# Patient Record
Sex: Male | Born: 1965 | Race: Black or African American | Hispanic: No | Marital: Single | Smoking: Former smoker
Health system: Southern US, Community
[De-identification: ages and names within clinical notes are randomized; demographics above are authoritative.]

## PROBLEM LIST (undated history)

## (undated) DIAGNOSIS — E119 Type 2 diabetes mellitus without complications: Secondary | ICD-10-CM

---

## 2011-12-17 ENCOUNTER — Encounter: Payer: Self-pay | Admitting: *Deleted

## 2011-12-17 ENCOUNTER — Emergency Department (INDEPENDENT_AMBULATORY_CARE_PROVIDER_SITE_OTHER)
Admission: EM | Admit: 2011-12-17 | Discharge: 2011-12-17 | Disposition: A | Discharge status: Home / Self Care | Payer: Self-pay | Source: Home / Self Care | Attending: Emergency Medicine | Admitting: Emergency Medicine

## 2011-12-17 DIAGNOSIS — K047 Periapical abscess without sinus: Secondary | ICD-10-CM

## 2011-12-17 MED ORDER — PENICILLIN V POTASSIUM 500 MG PO TABS: 500.0000 mg | ORAL_TABLET | Freq: Four times a day (QID) | ORAL | Status: AC

## 2011-12-17 MED ORDER — CLINDAMYCIN HCL 150 MG PO CAPS: 150.0000 mg | ORAL_CAPSULE | Freq: Three times a day (TID) | ORAL | Status: AC

## 2011-12-17 MED ORDER — HYDROCODONE-ACETAMINOPHEN 5-325 MG PO TABS: 2.0000 | ORAL_TABLET | Freq: Four times a day (QID) | ORAL | Status: AC | PRN

## 2011-12-17 NOTE — ED Notes (Signed)
PT  HAS  R  SIDED  FACIAL  SWELLING    X  5  DAYS  HE  REPORTS  THE   SWELLING IS  PAINFULL  TO  THE  [facility]

## 2011-12-17 NOTE — ED Provider Notes (Addendum)
History     CSN: [id number]  Arrival date & time 12/17/11  1123   First MD Initiated Contact with Patient 12/17/11 1129      Chief Complaint  Patient presents with  . Facial Swelling    (Consider location/radiation/quality/duration/timing/severity/associated sxs/prior treatment) HPI Comments: For about 5 days been noticing the R side of my face has becoming swollen and tender" Dontt know if this is coming from a tooth infection or what have felt some throbbing pains as well"  The history is provided by the patient.    History reviewed. No pertinent past medical history.  History reviewed. No pertinent past surgical history.  History reviewed. No pertinent family history.  History  Substance Use Topics  . Smoking status: Not on file  . Smokeless tobacco: Not on file  . Alcohol Use: Not on file      Review of Systems  Constitutional: Negative for fever.  Eyes: Negative for pain and visual disturbance.    Allergies  Review of patient's allergies indicates no known allergies.  Home Medications   Current Outpatient Rx  Name Route Sig Dispense Refill  . CLINDAMYCIN HCL 150 MG PO CAPS Oral Take 1 capsule (150 mg total) by mouth 3 (three) times daily. 15 capsule 0  . HYDROCODONE-ACETAMINOPHEN 5-325 MG PO TABS Oral Take 2 tablets by mouth every 6 (six) hours as needed for pain. 15 tablet 0  . PENICILLIN V POTASSIUM 500 MG PO TABS Oral Take 1 tablet (500 mg total) by mouth 4 (four) times daily. 40 tablet 0    BP 142/92  Pulse 103  Temp(Src) 98.9 F (37.2 C) (Oral)  Resp 20  SpO2 96%  Physical Exam  Nursing note and vitals reviewed. Constitutional: He appears well-developed and well-nourished.  HENT:  Head: Normocephalic.  Mouth/Throat: Uvula is midline. Abnormal dentition. Dental abscesses and dental caries present. No uvula swelling.    Eyes: Conjunctivae are normal.  Neck: Normal range of motion.    ED Course  Procedures (including critical care  time)  Labs Reviewed - No data to display No results found.   1. Dental abscess       MDM  Facial  pain and  Swelling, odontogenic infection        Jimmie Molly, MD 12/17/11 1756  Jimmie Molly, MD 12/17/11 1757

## 2011-12-24 ENCOUNTER — Encounter (HOSPITAL_COMMUNITY): Payer: Self-pay | Admitting: Internal Medicine

## 2011-12-24 ENCOUNTER — Encounter (HOSPITAL_COMMUNITY): Payer: Self-pay | Admitting: *Deleted

## 2011-12-24 ENCOUNTER — Inpatient Hospital Stay (HOSPITAL_COMMUNITY)
Admission: EM | Admit: 2011-12-24 | Discharge: 2011-12-31 | DRG: 682 | Disposition: A | Discharge status: Home / Self Care | Payer: Self-pay | Attending: Internal Medicine | Admitting: Internal Medicine

## 2011-12-24 ENCOUNTER — Emergency Department (INDEPENDENT_AMBULATORY_CARE_PROVIDER_SITE_OTHER)
Admission: EM | Admit: 2011-12-24 | Discharge: 2011-12-24 | Disposition: A | Discharge status: Nursing Home (Includes ICF) | Payer: Self-pay | Source: Home / Self Care | Attending: Family Medicine | Admitting: Family Medicine

## 2011-12-24 DIAGNOSIS — R739 Hyperglycemia, unspecified: Secondary | ICD-10-CM

## 2011-12-24 DIAGNOSIS — R42 Dizziness and giddiness: Secondary | ICD-10-CM | POA: Diagnosis present

## 2011-12-24 DIAGNOSIS — E1100 Type 2 diabetes mellitus with hyperosmolarity without nonketotic hyperglycemic-hyperosmolar coma (NKHHC): Secondary | ICD-10-CM | POA: Diagnosis present

## 2011-12-24 DIAGNOSIS — E1101 Type 2 diabetes mellitus with hyperosmolarity with coma: Secondary | ICD-10-CM | POA: Diagnosis present

## 2011-12-24 DIAGNOSIS — N471 Phimosis: Secondary | ICD-10-CM | POA: Diagnosis present

## 2011-12-24 DIAGNOSIS — IMO0001 Reserved for inherently not codable concepts without codable children: Secondary | ICD-10-CM | POA: Clinically undetermined

## 2011-12-24 DIAGNOSIS — Z79899 Other long term (current) drug therapy: Secondary | ICD-10-CM

## 2011-12-24 DIAGNOSIS — E1165 Type 2 diabetes mellitus with hyperglycemia: Secondary | ICD-10-CM

## 2011-12-24 DIAGNOSIS — N179 Acute kidney failure, unspecified: Principal | ICD-10-CM | POA: Diagnosis present

## 2011-12-24 DIAGNOSIS — E876 Hypokalemia: Secondary | ICD-10-CM | POA: Diagnosis present

## 2011-12-24 DIAGNOSIS — E860 Dehydration: Secondary | ICD-10-CM | POA: Diagnosis present

## 2011-12-24 DIAGNOSIS — R51 Headache: Secondary | ICD-10-CM | POA: Diagnosis present

## 2011-12-24 DIAGNOSIS — R5381 Other malaise: Secondary | ICD-10-CM | POA: Diagnosis present

## 2011-12-24 LAB — POCT URINALYSIS DIP (DEVICE)
Bilirubin Urine: NEGATIVE
Hgb urine dipstick: NEGATIVE
Ketones, ur: NEGATIVE mg/dL
Specific Gravity, Urine: <=1.005 (ref 1.005–1.030)
pH: 5.5 (ref 5.0–8.0)

## 2011-12-24 LAB — POCT I-STAT, CHEM 8
BUN: 22 mg/dL (ref 6–23)
Calcium, Ion: 1.17 mmol/L (ref 1.12–1.32)
Chloride: 93 mEq/L — ABNORMAL LOW (ref 96–112)
Glucose, Bld: >700 mg/dL (ref 70–99)

## 2011-12-24 LAB — CBC
HCT: 43.1 % (ref 39.0–52.0)
Hemoglobin: 15.7 g/dL (ref 13.0–17.0)
MCH: 30.5 pg (ref 26.0–34.0)
MCHC: 36.4 g/dL — ABNORMAL HIGH (ref 30.0–36.0)

## 2011-12-24 LAB — POCT I-STAT 3, VENOUS BLOOD GAS (G3P V)
Acid-Base Excess: 3 mmol/L — ABNORMAL HIGH (ref 0.0–2.0)
Bicarbonate: 29.2 mEq/L — ABNORMAL HIGH (ref 20.0–24.0)
O2 Saturation: 49 %
pO2, Ven: 28 mmHg — CL (ref 30.0–45.0)

## 2011-12-24 MED ORDER — DEXTROSE-NACL 5-0.45 % IV SOLN: INTRAVENOUS | Status: DC

## 2011-12-24 MED ORDER — SODIUM CHLORIDE 0.9 % IV SOLN
INTRAVENOUS | Status: DC
  Administered 2011-12-24: 21:00:00 via INTRAVENOUS

## 2011-12-24 MED ORDER — ONDANSETRON HCL 4 MG/2ML IJ SOLN: 4.0000 mg | Freq: Three times a day (TID) | INTRAMUSCULAR | Status: DC | PRN

## 2011-12-24 MED ORDER — INSULIN REGULAR BOLUS VIA INFUSION
0.0000 [IU] | Freq: Three times a day (TID) | INTRAVENOUS | Status: DC
  Filled 2011-12-24 (×4): qty 10

## 2011-12-24 MED ORDER — SODIUM CHLORIDE 0.9 % IV BOLUS (SEPSIS)
1000.0000 mL | Freq: Once | INTRAVENOUS | Status: AC
  Administered 2011-12-24: 1000 mL via INTRAVENOUS

## 2011-12-24 MED ORDER — SODIUM CHLORIDE 0.9 % IV SOLN
INTRAVENOUS | Status: DC
  Administered 2011-12-24: 5.4 [IU]/h via INTRAVENOUS
  Filled 2011-12-24: qty 1

## 2011-12-24 MED ORDER — DEXTROSE 50 % IV SOLN: 25.0000 mL | INTRAVENOUS | Status: DC | PRN

## 2011-12-24 NOTE — ED Notes (Addendum)
Patient brought to yellow for insulin drip  Patient started on telemetry on arrival to yellow 18 by D Chisa Kushner rn

## 2011-12-24 NOTE — H&P (Signed)
Malik Carter is an 46 y.o. male.   Chief Complaint: Polydipsia HPI: 46 YO with no significant PMHx except for recent Tooth abscess here with polydipsia, polyphagia and weakness. Patient found to have a CBG of over 800. Has no prior history of DM. His father has Diabetes. Symptoms have been going on for the last week only. No NVD but mild abdominal discomfort. No fever, no dizziness. Patient slightly nauseated but vomiting.  No past medical history on file.  No past surgical history on file.  Family History  Problem Relation Age of Onset  . Diabetes Father    Social History:  does not have a smoking history on file. He does not have any smokeless tobacco history on file. He reports that he does not drink alcohol. His drug history not on file.  Allergies: No Known Allergies  Medications Prior to Admission  Medication Dose Route Frequency Provider Last Rate Last Dose  . 0.9 %  sodium chloride infusion   Intravenous Continuous Tatyana A Kirichenko, PA 125 mL/hr at 12/24/11 2123    . dextrose 5 %-0.45 % sodium chloride infusion   Intravenous Continuous Tatyana A Kirichenko, PA      . dextrose 50 % solution 25 mL  25 mL Intravenous PRN Tatyana A Kirichenko, PA      . insulin regular (NOVOLIN R,HUMULIN R) 1 Units/mL in sodium chloride 0.9 % 100 mL infusion   Intravenous Continuous Tatyana A Kirichenko, PA      . insulin regular bolus via infusion 0-10 Units  0-10 Units Intravenous TID WC Tatyana A Kirichenko, PA      . ondansetron (ZOFRAN) injection 4 mg  4 mg Intravenous Q8H PRN Tatyana A Kirichenko, PA      . sodium chloride 0.9 % bolus 1,000 mL  1,000 mL Intravenous Once Tatyana A Kirichenko, PA   1,000 mL at 12/24/11 1939   Medications Prior to Admission  Medication Sig Dispense Refill  . HYDROcodone-acetaminophen (NORCO) 5-325 MG per tablet Take 2 tablets by mouth every 6 (six) hours as needed for pain.  15 tablet  0  . penicillin v potassium (VEETID) 500 MG tablet Take 1 tablet (500 mg  total) by mouth 4 (four) times daily.  40 tablet  0  . clindamycin (CLEOCIN) 150 MG capsule Take 1 capsule (150 mg total) by mouth 3 (three) times daily.  15 capsule  0    Results for orders placed during the hospital encounter of 12/24/11 (from the past 48 hour(s))  CBC     Status: Abnormal   Collection Time   12/24/11  7:20 PM      Component Value Range Comment   WBC 6.9  4.0 - 10.5 (K/uL)    RBC 5.14  4.22 - 5.81 (MIL/uL)    Hemoglobin 15.7  13.0 - 17.0 (g/dL)    HCT 28.4  13.2 - 44.0 (%)    MCV 83.9  78.0 - 100.0 (fL)    MCH 30.5  26.0 - 34.0 (pg)    MCHC 36.4 (*) 30.0 - 36.0 (g/dL)    RDW 10.2  72.5 - 36.6 (%)    Platelets 191  150 - 400 (K/uL)   BASIC METABOLIC PANEL     Status: Abnormal   Collection Time   12/24/11  7:20 PM      Component Value Range Comment   Sodium 125 (*) 135 - 145 (mEq/L)    Potassium 4.5  3.5 - 5.1 (mEq/L)    Chloride 89 (*) 96 -  112 (mEq/L)    CO2 23  19 - 32 (mEq/L)    Glucose, Bld 813 (*) 70 - 99 (mg/dL)    BUN 20  6 - 23 (mg/dL)    Creatinine, Ser 1.61 (*) 0.50 - 1.35 (mg/dL)    Calcium 9.6  8.4 - 10.5 (mg/dL)    GFR calc non Af Amer 51 (*) >90 (mL/min)    GFR calc Af Amer 59 (*) >90 (mL/min)   POCT I-STAT 3, BLOOD GAS (G3P V)     Status: Abnormal   Collection Time   12/24/11  7:40 PM      Component Value Range Comment   pH, Ven 7.379 (*) 7.250 - 7.300     pCO2, Ven 49.6  45.0 - 50.0 (mmHg)    pO2, Ven 28.0 (*) 30.0 - 45.0 (mmHg)    Bicarbonate 29.2 (*) 20.0 - 24.0 (mEq/L)    TCO2 31  0 - 100 (mmol/L)    O2 Saturation 49.0      Acid-Base Excess 3.0 (*) 0.0 - 2.0 (mmol/L)    Patient temperature 37.0 C      Sample type VENOUS      Comment NOTIFIED PHYSICIAN     GLUCOSE, CAPILLARY     Status: Abnormal   Collection Time   12/24/11  9:07 PM      Component Value Range Comment   Glucose-Capillary >600 (*) 70 - 99 (mg/dL)    No results found.  Review of Systems  Constitutional: Positive for malaise/fatigue and diaphoresis.  HENT: Negative.     Eyes: Negative.   Respiratory: Negative.   Cardiovascular: Negative.   Gastrointestinal: Negative.   Genitourinary: Negative.   Musculoskeletal: Negative.   Skin: Negative.   Neurological: Negative.   Endo/Heme/Allergies: Positive for polydipsia.  Psychiatric/Behavioral: Negative.     Blood pressure 123/77, temperature 98.2 F (36.8 C), temperature source Oral, resp. rate 14. Physical Exam  Constitutional: He is oriented to person, place, and time. He appears well-developed and well-nourished.  HENT:  Head: Normocephalic and atraumatic.  Right Ear: External ear normal.  Left Ear: External ear normal.  Nose: Nose normal.  Mouth/Throat: Oropharynx is clear and moist.  Eyes: Conjunctivae and EOM are normal. Pupils are equal, round, and reactive to light.  Neck: Normal range of motion. Neck supple.  Cardiovascular: Normal rate, regular rhythm, normal heart sounds and intact distal pulses.   Respiratory: Effort normal and breath sounds normal.  GI: Soft. Bowel sounds are normal.  Musculoskeletal: Normal range of motion.  Neurological: He is alert and oriented to person, place, and time. He has normal reflexes.  Skin: Skin is warm and dry.  Psychiatric: He has a normal mood and affect. His behavior is normal. Judgment and thought content normal.     Assessment/Plan 1. Hyperglycemia: due to new onset DM. Will admit to monitored bed, IV insulin, IV fluids and theehydration Glucose stabilizer protocol. 2. New Onset DM: Once CBG is controlled with IV insulin, we will transition patient to oral hypoglycemics with possible Lantus daily 3. Dehydration: Will aggressively hydrate 4. ARF: due to dehydration. Will hydrate and follow renal function closely  GARBA,LAWAL 12/24/2011, 9:48 PM

## 2011-12-24 NOTE — ED Notes (Signed)
Called for pt.  No response.

## 2011-12-24 NOTE — ED Notes (Signed)
Urgent care - glucose 750. Here to be evaluated. Sx. Urination, thirst,

## 2011-12-24 NOTE — ED Provider Notes (Signed)
BP 123/77  Temp(Src) 98.2 F (36.8 C) (Oral)  Resp 14 Pt well appearing, no distress New onset diabetes noted, but he is otherwise stable  Fam hx - diabetes Would benefit from rehydration/monitoring  Joya Gaskins, MD 12/24/11 1943

## 2011-12-24 NOTE — ED Notes (Signed)
Insulin gtt checked with diane

## 2011-12-24 NOTE — ED Provider Notes (Signed)
History     CSN: [id number]  Arrival date & time 12/24/11  1643   First MD Initiated Contact with Patient 12/24/11 1909      Chief Complaint  Patient presents with  . Polydipsia    (Consider location/radiation/quality/duration/timing/severity/associated sxs/prior treatment) Patient is a 46 y.o. male presenting with weakness. The history is provided by the patient.  Weakness The primary symptoms include headaches and dizziness. Primary symptoms do not include nausea. The symptoms began 3 to 5 days ago. The symptoms are worsening.  The headache is associated with weakness.  Dizziness also occurs with weakness. Dizziness does not occur with nausea.   Additional symptoms include weakness. Associated symptoms comments: Thirst, polyuria.  Pt states he has not been feeling well for about 4 days. States went to UC, glucose over 700, sent here. No prior diagnosis of diabetes. Denies any symptoms prior to this week.  No past medical history on file.  No past surgical history on file.  Family History  Problem Relation Age of Onset  . Diabetes Father     History  Substance Use Topics  . Smoking status: Not on file  . Smokeless tobacco: Not on file  . Alcohol Use:       Review of Systems  Eyes: Negative.   Respiratory: Negative.   Cardiovascular: Negative.   Gastrointestinal: Negative.  Negative for nausea.  Genitourinary: Positive for frequency. Negative for dysuria.  Musculoskeletal: Negative.   Skin: Negative.   Neurological: Positive for dizziness, weakness and headaches.  Psychiatric/Behavioral: Negative.     Allergies  Review of patient's allergies indicates no known allergies.  Home Medications   Current Outpatient Rx  Name Route Sig Dispense Refill  . HYDROCODONE-ACETAMINOPHEN 5-325 MG PO TABS Oral Take 2 tablets by mouth every 6 (six) hours as needed for pain. 15 tablet 0  . PENICILLIN V POTASSIUM 500 MG PO TABS Oral Take 1 tablet (500 mg total) by mouth 4  (four) times daily. 40 tablet 0  . CLINDAMYCIN HCL 150 MG PO CAPS Oral Take 1 capsule (150 mg total) by mouth 3 (three) times daily. 15 capsule 0    BP 123/77  Temp(Src) 98.2 F (36.8 C) (Oral)  Resp 14  Physical Exam  Nursing note and vitals reviewed. Constitutional: He is oriented to person, place, and time. He appears well-developed and well-nourished. No distress.  HENT:  Head: Normocephalic.  Eyes: Conjunctivae are normal.  Neck: Normal range of motion. Neck supple.  Cardiovascular: Normal rate, regular rhythm and normal heart sounds.   Pulmonary/Chest: Effort normal and breath sounds normal. No respiratory distress.  Abdominal: Soft. Bowel sounds are normal. He exhibits no distension.  Musculoskeletal: Normal range of motion.  Neurological: He is alert and oriented to person, place, and time.  Skin: Skin is warm and dry. No erythema.  Psychiatric: He has a normal mood and affect.    ED Course  Procedures (including critical care time)  Pt with new onset diabetes. Blood sugar over 800. Pt in NAD. Pt is not in DKA. UA was done at UC earlier and not repeated here.  VS all with in normal. Will admt for further treatment and monitoring.   Results for orders placed during the hospital encounter of 12/24/11  CBC      Component Value Range   WBC 6.9  4.0 - 10.5 (K/uL)   RBC 5.14  4.22 - 5.81 (MIL/uL)   Hemoglobin 15.7  13.0 - 17.0 (g/dL)   HCT 78.2  95.6 - 21.3 (%)  MCV 83.9  78.0 - 100.0 (fL)   MCH 30.5  26.0 - 34.0 (pg)   MCHC 36.4 (*) 30.0 - 36.0 (g/dL)   RDW 16.1  09.6 - 04.5 (%)   Platelets 191  150 - 400 (K/uL)  BASIC METABOLIC PANEL      Component Value Range   Sodium 125 (*) 135 - 145 (mEq/L)   Potassium 4.5  3.5 - 5.1 (mEq/L)   Chloride 89 (*) 96 - 112 (mEq/L)   CO2 23  19 - 32 (mEq/L)   Glucose, Bld 813 (*) 70 - 99 (mg/dL)   BUN 20  6 - 23 (mg/dL)   Creatinine, Ser 4.09 (*) 0.50 - 1.35 (mg/dL)   Calcium 9.6  8.4 - 81.1 (mg/dL)   GFR calc non Af Amer 51 (*)  >90 (mL/min)   GFR calc Af Amer 59 (*) >90 (mL/min)  POCT I-STAT 3, BLOOD GAS (G3P V)      Component Value Range   pH, Ven 7.379 (*) 7.250 - 7.300    pCO2, Ven 49.6  45.0 - 50.0 (mmHg)   pO2, Ven 28.0 (*) 30.0 - 45.0 (mmHg)   Bicarbonate 29.2 (*) 20.0 - 24.0 (mEq/L)   TCO2 31  0 - 100 (mmol/L)   O2 Saturation 49.0     Acid-Base Excess 3.0 (*) 0.0 - 2.0 (mmol/L)   Patient temperature 37.0 C     Sample type VENOUS     Comment NOTIFIED PHYSICIAN    GLUCOSE, CAPILLARY      Component Value Range   Glucose-Capillary >600 (*) 70 - 99 (mg/dL)  GLUCOSE, CAPILLARY      Component Value Range   Glucose-Capillary 477 (*) 70 - 99 (mg/dL)  GLUCOSE, CAPILLARY      Component Value Range   Glucose-Capillary 410 (*) 70 - 99 (mg/dL)   Comment 1 Notify RN     Comment 2 Documented in Chart     No results found.     MDM          Lottie Mussel, PA 12/25/11 [phone]

## 2011-12-24 NOTE — ED Notes (Signed)
Dr. Artis Flock & RN Pia Mau notified of high glucose reading

## 2011-12-24 NOTE — ED Notes (Signed)
I gave the patient a cup of ice and a diet ginger-ale. 

## 2011-12-24 NOTE — ED Notes (Signed)
Pt   Seen  4  Days  Ago  For   Tooth  Problem  He  Was  Placed  On  Anti  Biotics     He  Now  Reports  Symptoms  Of  Frequent  Urination    Increased  Thirst  And  Feeling  Somewhat  Weak   -       He  denys  Any  History  Of  Diabetes  But  States  It  Runs  In  Family     -  At  This  Time  He  Is  Awake  And  Alert  Skin is  Warm  /  Dry

## 2011-12-24 NOTE — ED Provider Notes (Signed)
History     CSN: [id number]  Arrival date & time 12/24/11  1339   First MD Initiated Contact with Patient 12/24/11 1501      Chief Complaint  Patient presents with  . Blood Sugar Problem    (Consider location/radiation/quality/duration/timing/severity/associated sxs/prior treatment) Patient is a 46 y.o. male presenting with diabetes problem. The history is provided by the patient.  Diabetes He presents for his initial diabetic visit. The initial diagnosis of diabetes was made 4 days ago. His disease course has been worsening. There are no hypoglycemic associated symptoms. Associated symptoms include blurred vision, polydipsia, polyphagia, polyuria, visual change and weakness. Pertinent negatives for diabetes include no foot paresthesias and no weight loss. There are no hypoglycemic complications. Symptoms have been present for 4 days. There are no diabetic complications. When asked about current treatments, none were reported.    History reviewed. No pertinent past medical history.  History reviewed. No pertinent past surgical history.  Family History  Problem Relation Age of Onset  . Diabetes Father     History  Substance Use Topics  . Smoking status: Not on file  . Smokeless tobacco: Not on file  . Alcohol Use:       Review of Systems  Constitutional: Negative for weight loss.  HENT: Negative.   Eyes: Positive for blurred vision.  Gastrointestinal: Negative.   Genitourinary: Positive for polyuria and frequency.  Neurological: Positive for weakness.  Hematological: Positive for polydipsia and polyphagia.    Allergies  Review of patient's allergies indicates no known allergies.  Home Medications   Current Outpatient Rx  Name Route Sig Dispense Refill  . CLINDAMYCIN HCL 150 MG PO CAPS Oral Take 1 capsule (150 mg total) by mouth 3 (three) times daily. 15 capsule 0  . HYDROCODONE-ACETAMINOPHEN 5-325 MG PO TABS Oral Take 2 tablets by mouth every 6 (six) hours as  needed for pain. 15 tablet 0  . PENICILLIN V POTASSIUM 500 MG PO TABS Oral Take 1 tablet (500 mg total) by mouth 4 (four) times daily. 40 tablet 0    BP 149/86  Pulse 81  Temp(Src) 98.1 F (36.7 C) (Oral)  Resp 16  SpO2 100%  Physical Exam  Nursing note and vitals reviewed. Constitutional: He appears well-developed and well-nourished.  HENT:  Head: Normocephalic.  Neck: Neck supple.  Cardiovascular: Regular rhythm, normal heart sounds, intact distal pulses and normal pulses.  Tachycardia present.   Pulmonary/Chest: Effort normal and breath sounds normal.  Abdominal: Soft. Bowel sounds are normal. There is no tenderness.  Musculoskeletal: He exhibits no edema.  Lymphadenopathy:    He has no cervical adenopathy.  Neurological: He is alert.  Skin: Skin is warm and dry.  Psychiatric: He has a normal mood and affect.    ED Course  Procedures (including critical care time)  Labs Reviewed  GLUCOSE, CAPILLARY - Abnormal; Notable for the following:    Glucose-Capillary >600 (*)    All other components within normal limits  POCT URINALYSIS DIP (DEVICE) - Abnormal; Notable for the following:    Glucose, UA 500 (*)    All other components within normal limits  I-STAT, CHEM 8  POCT URINALYSIS DIPSTICK   No results found.   1. Diabetes mellitus out of control       MDM  Bs> 700.        Barkley Bruns, MD 12/24/11 [phone]

## 2011-12-25 ENCOUNTER — Encounter (HOSPITAL_COMMUNITY): Payer: Self-pay | Admitting: *Deleted

## 2011-12-25 DIAGNOSIS — E876 Hypokalemia: Secondary | ICD-10-CM | POA: Diagnosis present

## 2011-12-25 LAB — GLUCOSE, CAPILLARY
Glucose-Capillary: 139 mg/dL — ABNORMAL HIGH (ref 70–99)
Glucose-Capillary: 163 mg/dL — ABNORMAL HIGH (ref 70–99)
Glucose-Capillary: 197 mg/dL — ABNORMAL HIGH (ref 70–99)
Glucose-Capillary: 201 mg/dL — ABNORMAL HIGH (ref 70–99)
Glucose-Capillary: 213 mg/dL — ABNORMAL HIGH (ref 70–99)
Glucose-Capillary: 257 mg/dL — ABNORMAL HIGH (ref 70–99)
Glucose-Capillary: 309 mg/dL — ABNORMAL HIGH (ref 70–99)
Glucose-Capillary: 323 mg/dL — ABNORMAL HIGH (ref 70–99)
Glucose-Capillary: 357 mg/dL — ABNORMAL HIGH (ref 70–99)
Glucose-Capillary: 410 mg/dL — ABNORMAL HIGH (ref 70–99)
Glucose-Capillary: 477 mg/dL — ABNORMAL HIGH (ref 70–99)

## 2011-12-25 LAB — BASIC METABOLIC PANEL
BUN: 20 mg/dL (ref 6–23)
BUN: 15 mg/dL (ref 6–23)
Chloride: 104 mEq/L (ref 96–112)
Creatinine, Ser: 1.25 mg/dL (ref 0.50–1.35)
GFR calc Af Amer: 78 mL/min — ABNORMAL LOW (ref >90)
GFR calc non Af Amer: 51 mL/min — ABNORMAL LOW (ref >90)
GFR calc non Af Amer: 68 mL/min — ABNORMAL LOW (ref >90)
Glucose, Bld: 813 mg/dL (ref 70–99)
Glucose, Bld: 195 mg/dL — ABNORMAL HIGH (ref 70–99)
Potassium: 4.5 mEq/L (ref 3.5–5.1)
Potassium: 3.4 mEq/L — ABNORMAL LOW (ref 3.5–5.1)

## 2011-12-25 LAB — CBC
HCT: 40.7 % (ref 39.0–52.0)
MCV: 84.3 fL (ref 78.0–100.0)
RBC: 4.83 MIL/uL (ref 4.22–5.81)
RDW: 12.1 % (ref 11.5–15.5)
WBC: 4.7 10*3/uL (ref 4.0–10.5)

## 2011-12-25 LAB — TSH
TSH: 1.705 u[IU]/mL (ref 0.350–4.500)
TSH: 0.919 u[IU]/mL (ref 0.350–4.500)

## 2011-12-25 MED ORDER — SODIUM CHLORIDE 0.9 % IV SOLN
INTRAVENOUS | Status: DC
  Filled 2011-12-25: qty 1

## 2011-12-25 MED ORDER — INSULIN GLARGINE 100 UNIT/ML ~~LOC~~ SOLN
10.0000 [IU] | Freq: Every day | SUBCUTANEOUS | Status: DC
  Filled 2011-12-25: qty 3

## 2011-12-25 MED ORDER — PNEUMOCOCCAL VAC POLYVALENT 25 MCG/0.5ML IJ INJ
0.5000 mL | INJECTION | INTRAMUSCULAR | Status: AC
  Administered 2011-12-26: 0.5 mL via INTRAMUSCULAR
  Filled 2011-12-25: qty 0.5

## 2011-12-25 MED ORDER — DEXTROSE-NACL 5-0.45 % IV SOLN
INTRAVENOUS | Status: DC
  Administered 2011-12-25: 05:00:00 via INTRAVENOUS

## 2011-12-25 MED ORDER — ONDANSETRON HCL 4 MG PO TABS: 4.0000 mg | ORAL_TABLET | Freq: Four times a day (QID) | ORAL | Status: DC | PRN

## 2011-12-25 MED ORDER — POTASSIUM CHLORIDE CRYS ER 20 MEQ PO TBCR
40.0000 meq | EXTENDED_RELEASE_TABLET | Freq: Once | ORAL | Status: AC
  Administered 2011-12-25: 40 meq via ORAL

## 2011-12-25 MED ORDER — ONDANSETRON HCL 4 MG/2ML IJ SOLN: 4.0000 mg | Freq: Four times a day (QID) | INTRAMUSCULAR | Status: DC | PRN

## 2011-12-25 MED ORDER — INSULIN REGULAR BOLUS VIA INFUSION
0.0000 [IU] | Freq: Three times a day (TID) | INTRAVENOUS | Status: DC
  Filled 2011-12-25: qty 10

## 2011-12-25 MED ORDER — INSULIN GLARGINE 100 UNIT/ML ~~LOC~~ SOLN
10.0000 [IU] | SUBCUTANEOUS | Status: AC
  Administered 2011-12-25: 10 [IU] via SUBCUTANEOUS
  Filled 2011-12-25: qty 3

## 2011-12-25 MED ORDER — INSULIN ASPART 100 UNIT/ML ~~LOC~~ SOLN
0.0000 [IU] | Freq: Three times a day (TID) | SUBCUTANEOUS | Status: DC
  Administered 2011-12-25: 15 [IU] via SUBCUTANEOUS
  Administered 2011-12-25: 3 [IU] via SUBCUTANEOUS
  Administered 2011-12-26 (×3): 11 [IU] via SUBCUTANEOUS
  Filled 2011-12-25: qty 3

## 2011-12-25 MED ORDER — INSULIN ASPART 100 UNIT/ML ~~LOC~~ SOLN
3.0000 [IU] | Freq: Once | SUBCUTANEOUS | Status: AC
  Administered 2011-12-25: 3 [IU] via SUBCUTANEOUS
  Filled 2011-12-25: qty 3

## 2011-12-25 MED ORDER — INFLUENZA VIRUS VACC SPLIT PF IM SUSP
0.5000 mL | INTRAMUSCULAR | Status: AC | PRN
  Administered 2011-12-28: 0.5 mL via INTRAMUSCULAR
  Filled 2011-12-25 (×2): qty 0.5

## 2011-12-25 MED ORDER — DEXTROSE 50 % IV SOLN: 25.0000 mL | INTRAVENOUS | Status: DC | PRN

## 2011-12-25 MED ORDER — SODIUM CHLORIDE 0.9 % IV SOLN
INTRAVENOUS | Status: DC
  Administered 2011-12-25: 10:00:00 via INTRAVENOUS

## 2011-12-25 MED ORDER — POTASSIUM CHLORIDE CRYS ER 20 MEQ PO TBCR
EXTENDED_RELEASE_TABLET | ORAL | Status: AC
  Administered 2011-12-25: 40 meq via ORAL
  Filled 2011-12-25: qty 2

## 2011-12-25 MED ORDER — LIVING WELL WITH DIABETES BOOK
Freq: Once | Status: AC
  Administered 2011-12-25: 12:00:00
  Filled 2011-12-25: qty 1

## 2011-12-25 NOTE — Plan of Care (Signed)
Problem: Phase I Progression Outcomes Goal: Other Phase I Outcomes/Goals Outcome: Completed/Met Date Met:  12/25/11 Case management consult initiated

## 2011-12-25 NOTE — Plan of Care (Signed)
Problem: Consults Goal: Diagnosis-Diabetes Mellitus Outcome: Completed/Met Date Met:  12/25/11 New Onset Type II   Problem: Phase I Progression Outcomes Goal: NPO or per MD order Outcome: Completed/Met Date Met:  12/25/11 Diet advanced to carb modified medium

## 2011-12-25 NOTE — Progress Notes (Signed)
Subjective: Patient is without any complaints today. He feels fine  Objective: Vital signs in last 24 hours: Temp:  [97.7 F (36.5 C)-98.2 F (36.8 C)] 97.7 F (36.5 C) (01/05 0522) Pulse Rate:  [80-81] 80  (01/05 0522) Resp:  [13-16] 16  (01/05 0522) BP: (104-149)/(68-92) 104/68 mmHg (01/05 0522) SpO2:  [92 %-100 %] 92 % (01/05 0522) Weight:  [119 kg (262 lb 5.6 oz)] 262 lb 5.6 oz (119 kg) (01/04 2300) Weight change:   -Intake/Output from previous day: 01/04 0701 - 01/05 0700 In: 360 [P.O.:360] Out: 1925 [Urine:1925]  Blood pressure 104/68, pulse 80, temperature 97.7 F (36.5 C), temperature source Oral, resp. rate 16, height 5\' 11"  (1.803 m), weight 119 kg (262 lb 5.6 oz), SpO2 92.00%. General: Patient appears his stated age. Cardiovascular: Regular rate rhythm. Lungs: Clear to auscultation bilaterally. Abdomen: Soft nontender positive bowel sounds. Extremities: No edema.  Lab Results: Lab Results  Component Value Date   WBC 4.7 12/25/2011   HGB 14.4 12/25/2011   HCT 40.7 12/25/2011   MCV 84.3 12/25/2011   PLT 184 12/25/2011    BMET  Lab 12/25/11 0914  NA 138  K 3.4*  CL 104  CO2 23  BUN 15  CREATININE 1.25  LABGLOM --  GLUCOSE 195*  CALCIUM 8.6    Studies/Results: Reviewed  Medications:  Current Facility-Administered Medications  Medication Dose Route Frequency Provider Last Rate Last Dose  . 0.9 %  sodium chloride infusion   Intravenous Continuous Ramiro Harvest 75 mL/hr at 12/25/11 0932    . dextrose 50 % solution 25 mL  25 mL Intravenous PRN Lawal Garba      . insulin aspart (novoLOG) injection 0-15 Units  0-15 Units Subcutaneous TID WC Ramiro Harvest      . insulin glargine (LANTUS) injection 10 Units  10 Units Subcutaneous NOW Ramiro Harvest   10 Units at 12/25/11 1018  . insulin regular (NOVOLIN R,HUMULIN R) 1 Units/mL in sodium chloride 0.9 % 100 mL infusion   Intravenous Continuous Daniel Thompson 5.7 mL/hr at 12/25/11 1119 5.7 Units/hr at 12/25/11  1119  . living well with diabetes book MISC   Does not apply Once Ohn Bostic Jarrett-Davis      . ondansetron (ZOFRAN) tablet 4 mg  4 mg Oral Q6H PRN Lawal Garba       Or  . ondansetron (ZOFRAN) injection 4 mg  4 mg Intravenous Q6H PRN Lawal Garba      . potassium chloride SA (K-DUR,KLOR-CON) CR tablet 40 mEq  40 mEq Oral Once Ramiro Harvest      . sodium chloride 0.9 % bolus 1,000 mL  1,000 mL Intravenous Once Tatyana A Kirichenko, PA   1,000 mL at 12/24/11 1939  . DISCONTD: 0.9 %  sodium chloride infusion   Intravenous Continuous Tatyana A Kirichenko, PA 125 mL/hr at 12/24/11 2123    . DISCONTD: dextrose 5 %-0.45 % sodium chloride infusion   Intravenous Continuous Tatyana A Kirichenko, PA      . DISCONTD: dextrose 5 %-0.45 % sodium chloride infusion   Intravenous Continuous Lawal Garba 125 mL/hr at 12/25/11 0501    . DISCONTD: dextrose 50 % solution 25 mL  25 mL Intravenous PRN Tatyana A Kirichenko, PA      . DISCONTD: insulin glargine (LANTUS) injection 10 Units  10 Units Subcutaneous QHS Ramiro Harvest      . DISCONTD: insulin regular (NOVOLIN R,HUMULIN R) 1 Units/mL in sodium chloride 0.9 % 100 mL infusion   Intravenous Continuous Tatyana  A Kirichenko, PA 1.6 mL/hr at 12/25/11 0456 1.6 Units/hr at 12/25/11 0456  . DISCONTD: insulin regular (NOVOLIN R,HUMULIN R) 1 Units/mL in sodium chloride 0.9 % 100 mL infusion   Intravenous Continuous Lawal Garba 4.2 mL/hr at 12/25/11 0711 4.2 Units/hr at 12/25/11 0711  . DISCONTD: insulin regular bolus via infusion 0-10 Units  0-10 Units Intravenous TID WC Tatyana A Kirichenko, PA      . DISCONTD: insulin regular bolus via infusion 0-10 Units  0-10 Units Intravenous TID WC Lawal Garba      . DISCONTD: ondansetron (ZOFRAN) injection 4 mg  4 mg Intravenous Q8H PRN Tatyana A Kirichenko, PA        Assessment/Plan: Type 2 diabetes mellitus with hyperosmolar nonketotic hyperglycemia Start patient on Lantus 10 units now and then discontinue insulin drip one hour  after Lantus started. Discontinue D5 IV fluids. Start him on normal saline at 75. Will check hemoglobin A1c and fasting lipid panel. Will ask the dietitian to see patient. Will also ask care manager to see patient to arrange for outpatient followup.   Dehydration Resolve with IV fluids.   ARF (acute renal failure) Creatinine improves with IV fluids. Will continue to monitor.   Hypokalemia Replete.  Disposition: 1 to 2 days.    LOS: 1 day   Earlene Plater MD, [facility]  [phone] 12/25/2011, 11:57 AM

## 2011-12-25 NOTE — Plan of Care (Signed)
Problem: Food- and Nutrition-Related Knowledge Deficit (NB-1.1) Goal: Nutrition education Formal process to instruct or train a patient/client in a skill or to impart knowledge to help patients/clients voluntarily manage or modify food choices and eating behavior to maintain or improve health.  Outcome: Completed/Met Date Met:  12/25/11 RD consult for DM diet education. Reviewed guidelines and recommendations. Handouts provided from [employer]. BMI = 36.5 kg/m2 (Obesity Class II). No further nutrition intervention indicated at this time.  Alger Memos Pager # [phone]

## 2011-12-26 LAB — BASIC METABOLIC PANEL
CO2: 21 mEq/L (ref 19–32)
Calcium: 8.6 mg/dL (ref 8.4–10.5)
Chloride: 105 mEq/L (ref 96–112)
Creatinine, Ser: 1.24 mg/dL (ref 0.50–1.35)
GFR calc Af Amer: 79 mL/min — ABNORMAL LOW (ref >90)
Sodium: 137 mEq/L (ref 135–145)

## 2011-12-26 LAB — CBC
MCV: 85.6 fL (ref 78.0–100.0)
Platelets: 160 10*3/uL (ref 150–400)
RBC: 4.66 MIL/uL (ref 4.22–5.81)
RDW: 12.3 % (ref 11.5–15.5)
WBC: 4 10*3/uL (ref 4.0–10.5)

## 2011-12-26 LAB — LIPID PANEL
Cholesterol: 182 mg/dL (ref 0–200)
Total CHOL/HDL Ratio: 3 RATIO

## 2011-12-26 LAB — GLUCOSE, CAPILLARY
Glucose-Capillary: 321 mg/dL — ABNORMAL HIGH (ref 70–99)
Glucose-Capillary: 312 mg/dL — ABNORMAL HIGH (ref 70–99)
Glucose-Capillary: 301 mg/dL — ABNORMAL HIGH (ref 70–99)
Glucose-Capillary: 250 mg/dL — ABNORMAL HIGH (ref 70–99)

## 2011-12-26 MED ORDER — GLYBURIDE 2.5 MG PO TABS
2.5000 mg | ORAL_TABLET | Freq: Two times a day (BID) | ORAL | Status: DC
  Administered 2011-12-26 – 2011-12-27 (×2): 2.5 mg via ORAL
  Filled 2011-12-26 (×4): qty 1

## 2011-12-26 MED ORDER — METFORMIN HCL 500 MG PO TABS
500.0000 mg | ORAL_TABLET | Freq: Two times a day (BID) | ORAL | Status: DC
  Administered 2011-12-26 – 2011-12-27 (×2): 500 mg via ORAL
  Filled 2011-12-26 (×4): qty 1

## 2011-12-26 MED ORDER — INSULIN ASPART 100 UNIT/ML ~~LOC~~ SOLN
0.0000 [IU] | Freq: Three times a day (TID) | SUBCUTANEOUS | Status: DC
  Administered 2011-12-27: 8 [IU] via SUBCUTANEOUS
  Administered 2011-12-27: 3 [IU] via SUBCUTANEOUS
  Administered 2011-12-27 – 2011-12-28 (×2): 8 [IU] via SUBCUTANEOUS
  Administered 2011-12-28: 5 [IU] via SUBCUTANEOUS
  Administered 2011-12-28: 8 [IU] via SUBCUTANEOUS
  Administered 2011-12-29 (×2): 5 [IU] via SUBCUTANEOUS
  Administered 2011-12-29: 2 [IU] via SUBCUTANEOUS
  Administered 2011-12-30 (×2): 3 [IU] via SUBCUTANEOUS
  Administered 2011-12-31: 2 [IU] via SUBCUTANEOUS
  Administered 2011-12-31: 3 [IU] via SUBCUTANEOUS
  Filled 2011-12-26: qty 3

## 2011-12-26 MED ORDER — INSULIN ASPART 100 UNIT/ML ~~LOC~~ SOLN
0.0000 [IU] | Freq: Every day | SUBCUTANEOUS | Status: DC
  Administered 2011-12-26: 5 [IU] via SUBCUTANEOUS
  Administered 2011-12-27 – 2011-12-29 (×2): 2 [IU] via SUBCUTANEOUS
  Filled 2011-12-26: qty 3

## 2011-12-26 NOTE — ED Provider Notes (Signed)
Medical screening examination/treatment/procedure(s) were conducted as a shared visit with non-physician practitioner(s) and myself.  I personally evaluated the patient during the encounter   Frankye Schwegel W Wayburn Shaler, MD 12/26/11 0842 

## 2011-12-26 NOTE — Progress Notes (Signed)
Subjective: Pt is without new complaints, but does acknowledge that his sugars remain in the 300's on SSI.  Objective: Vital signs in last 24 hours: Temp:  [97.7 F (36.5 C)-98.1 F (36.7 C)] 97.7 F (36.5 C) (01/06 0500) Pulse Rate:  [59-84] 63  (01/06 0500) Resp:  [17-18] 17  (01/06 0500) BP: (120-132)/(70-80) 124/70 mmHg (01/06 0500) SpO2:  [97 %-99 %] 98 % (01/06 0500) Weight change:  Last BM Date: 12/25/11  Intake/Output from previous day: 01/05 0701 - 01/06 0700 In: 2112.4 [P.O.:320; I.V.:1792.4] Out: -  Intake/Output this shift:    General appearance: alert, cooperative, appears stated age, no distress and moderately obese Head: Normocephalic, without obvious abnormality, atraumatic Neck: no adenopathy, no carotid bruit, no JVD, supple, symmetrical, trachea midline and thyroid not enlarged, symmetric, no tenderness/mass/nodules Resp: clear to auscultation bilaterally Cardio: regular rate and rhythm, S1, S2 normal, no murmur, click, rub or gallop and no rub GI: soft, non-tender; bowel sounds normal; no masses,  no organomegaly Extremities: extremities normal, atraumatic, no cyanosis or edema Pulses: 2+ and symmetric Neurologic: Alert and oriented X 3, normal strength and tone. Normal symmetric reflexes. Normal coordination and gait  Lab Results:  Basename 12/26/11 0714 12/25/11 0555  WBC 4.0 4.7  HGB 14.1 14.4  HCT 39.9 40.7  PLT 160 184   BMET  Basename 12/26/11 0714 12/25/11 0914  NA 137 138  K 4.0 3.4*  CL 105 104  CO2 21 23  GLUCOSE 335* 195*  BUN 14 15  CREATININE 1.24 1.25  CALCIUM 8.6 8.6    Studies/Results: No results found.  Medications: I have reviewed the patient's current medications.  Assessment/Plan: 1. Type 2 DM with hyperosmolar nonketotic hyperglycemia.  FSBS considerably improved from admission, but still out of control.  Will add oral agents Metformin and Glyburide and continue to monitor FSBS. 2. Dehydration - improved. Heplock  IV. 3. ARF   - Doubt.  Pt is a large [place] male with good muscle mass.  I agree with the calculated GFR of 79. 4. Nausea and vomiting - resolved.   LOS: 2 days   Mane Consolo 12/26/2011, 11:45 AM

## 2011-12-26 NOTE — Plan of Care (Signed)
Problem: Phase II Progression Outcomes Goal: Progress activity as tolerated unless otherwise ordered Outcome: Completed/Met Date Met:  12/26/11 Pt OOB and ambulating in the halls  Problem: Phase III Progression Outcomes Goal: Patient verbalizes diet requirements Outcome: Completed/Met Date Met:  12/26/11 Verbalizes an understanding of reading food labels and carb counting

## 2011-12-27 LAB — GLUCOSE, CAPILLARY
Glucose-Capillary: 255 mg/dL — ABNORMAL HIGH (ref 70–99)
Glucose-Capillary: 287 mg/dL — ABNORMAL HIGH (ref 70–99)
Glucose-Capillary: 189 mg/dL — ABNORMAL HIGH (ref 70–99)
Glucose-Capillary: 223 mg/dL — ABNORMAL HIGH (ref 70–99)

## 2011-12-27 LAB — COMPREHENSIVE METABOLIC PANEL
ALT: 50 U/L (ref 0–53)
AST: 25 U/L (ref 0–37)
Albumin: 3.3 g/dL — ABNORMAL LOW (ref 3.5–5.2)
Alkaline Phosphatase: 88 U/L (ref 39–117)
Calcium: 8.8 mg/dL (ref 8.4–10.5)
GFR calc Af Amer: 88 mL/min — ABNORMAL LOW (ref >90)
Potassium: 3.7 mEq/L (ref 3.5–5.1)
Sodium: 138 mEq/L (ref 135–145)
Total Protein: 6.8 g/dL (ref 6.0–8.3)

## 2011-12-27 MED ORDER — METFORMIN HCL 500 MG PO TABS
1000.0000 mg | ORAL_TABLET | Freq: Two times a day (BID) | ORAL | Status: DC
  Administered 2011-12-27 – 2011-12-31 (×8): 1000 mg via ORAL
  Filled 2011-12-27 (×10): qty 2

## 2011-12-27 MED ORDER — ACETAMINOPHEN 325 MG PO TABS
650.0000 mg | ORAL_TABLET | ORAL | Status: DC | PRN
  Administered 2011-12-27: 650 mg via ORAL
  Filled 2011-12-27: qty 2

## 2011-12-27 MED ORDER — INSULIN PEN STARTER KIT
1.0000 | Freq: Once | Status: AC
  Administered 2011-12-27: 1
  Filled 2011-12-27: qty 1

## 2011-12-27 MED ORDER — INSULIN GLARGINE 100 UNIT/ML ~~LOC~~ SOLN
10.0000 [IU] | Freq: Every day | SUBCUTANEOUS | Status: DC
  Administered 2011-12-27: 10 [IU] via SUBCUTANEOUS
  Filled 2011-12-27: qty 3

## 2011-12-27 MED ORDER — BD GETTING STARTED TAKE HOME KIT: 3/10ML X 30G SYRINGES
1.0000 | Freq: Once | Status: AC
  Administered 2011-12-27: 1
  Filled 2011-12-27: qty 1

## 2011-12-27 MED ORDER — GLYBURIDE 5 MG PO TABS
5.0000 mg | ORAL_TABLET | Freq: Two times a day (BID) | ORAL | Status: DC
  Administered 2011-12-27 – 2011-12-31 (×8): 5 mg via ORAL
  Filled 2011-12-27 (×10): qty 1

## 2011-12-27 MED ORDER — PNEUMOCOCCAL VAC POLYVALENT 25 MCG/0.5ML IJ INJ
0.5000 mL | INJECTION | INTRAMUSCULAR | Status: AC
  Filled 2011-12-27 (×2): qty 0.5

## 2011-12-27 NOTE — Plan of Care (Signed)
Problem: Phase III Progression Outcomes Goal: Request MD order for outpatient Diabetes Mellitus education Outcome: Completed/Met Date Met:  12/27/11 Case manager consulted on 12/25/11 to arrange  Problem: Discharge Progression Outcomes Goal: Obtain signed CBG meter Rx form Outcome: Progressing Form placed on shadow chart for MD to sign

## 2011-12-27 NOTE — Progress Notes (Signed)
Utilization review completed.  

## 2011-12-27 NOTE — Progress Notes (Signed)
Subjective: Pt is without new complaint.  FSBS are somewhat improved on PO meds, but need improvement.  Objective: Vital signs in last 24 hours: Temp:  [98.1 F (36.7 C)-98.3 F (36.8 C)] 98.1 F (36.7 C) (01/07 1500) Pulse Rate:  [66-75] 75  (01/07 1500) Resp:  [17-18] 18  (01/07 1500) BP: (104-112)/(66-73) 109/68 mmHg (01/07 1500) SpO2:  [97 %-98 %] 97 % (01/07 1500) Weight change:  Last BM Date: 12/26/11  Intake/Output from previous day: 01/06 0701 - 01/07 0700 In: 1288.8 [P.O.:840; I.V.:448.8] Out: -  Intake/Output this shift: Total I/O In: 240 [P.O.:240] Out: -   General appearance: alert, cooperative, appears stated age, no distress and moderately obese Head: Normocephalic, without obvious abnormality, atraumatic Neck: no adenopathy, no carotid bruit, no JVD, supple, symmetrical, trachea midline and thyroid not enlarged, symmetric, no tenderness/mass/nodules Resp: clear to auscultation bilaterally Cardio: regular rate and rhythm, S1, S2 normal, no murmur, click, rub or gallop and no rub GI: soft, non-tender; bowel sounds normal; no masses,  no organomegaly Extremities: extremities normal, atraumatic, no cyanosis or edema and Homans sign is negative, no sign of DVT Pulses: 2+ and symmetric Skin: Skin color, texture, turgor normal. No rashes or lesions   Lab Results:  Basename 12/26/11 0714 12/25/11 0555  WBC 4.0 4.7  HGB 14.1 14.4  HCT 39.9 40.7  PLT 160 184   BMET  Basename 12/27/11 0550 12/26/11 0714  NA 138 137  K 3.7 4.0  CL 105 105  CO2 22 21  GLUCOSE 236* 335*  BUN 14 14  CREATININE 1.13 1.24  CALCIUM 8.8 8.6    Studies/Results: No results found.  Medications: I have reviewed the patient's current medications.  Assessment/Plan: 1. Type 2 DM with hyperosmolar nonketotic hyperglycemia. FSBS considerably improved from admission, but still out of control. Will increase Metformin and Glyburide dosages and add Lantus and continue to monitor FSBS. I  have discussed with the patient the importance of increasing his activity level and diet in treating his diabetes. 2. Dehydration - resolved. 3. ARF - Doubt. Pt is a large [place] male with good muscle mass. I agree with the calculated GFR of 79.  4. Nausea and vomiting - resolved.   LOS: 3 days   Ky Moskowitz 12/27/2011, 4:15 PM

## 2011-12-27 NOTE — Progress Notes (Signed)
Nutrition Follow-up/consult  Diet Order:  Carb modified PO intake: 100%  Meds: Scheduled Meds:   . glyBURIDE  2.5 mg Oral BID WC  . insulin aspart  0-15 Units Subcutaneous TID WC  . insulin aspart  0-5 Units Subcutaneous QHS  . metFORMIN  500 mg Oral BID WC  . pneumococcal 23 valent vaccine  0.5 mL Intramuscular Tomorrow-1000  . DISCONTD: insulin aspart  0-15 Units Subcutaneous TID WC   Continuous Infusions:   . insulin (NOVOLIN-R) infusion 5.7 Units/hr (12/25/11 1119)  . DISCONTD: sodium chloride 75 mL/hr at 12/25/11 0932   PRN Meds:.acetaminophen, dextrose, influenza  inactive virus vaccine, ondansetron (ZOFRAN) IV, ondansetron  Labs:  CMP     Component Value Date/Time   NA 138 12/27/2011 0550   K 3.7 12/27/2011 0550   CL 105 12/27/2011 0550   CO2 22 12/27/2011 0550   GLUCOSE 236* 12/27/2011 0550   BUN 14 12/27/2011 0550   CREATININE 1.13 12/27/2011 0550   CALCIUM 8.8 12/27/2011 0550   PROT 6.8 12/27/2011 0550   ALBUMIN 3.3* 12/27/2011 0550   AST 25 12/27/2011 0550   ALT 50 12/27/2011 0550   ALKPHOS 88 12/27/2011 0550   BILITOT 0.6 12/27/2011 0550   GFRNONAA 76* 12/27/2011 0550   GFRAA 88* 12/27/2011 0550     Intake/Output Summary (Last 24 hours) at 12/27/11 1029 Last data filed at 12/27/11 0900  Gross per 24 hour  Intake 1288.75 ml  Output      0 ml  Net 1288.75 ml   Diet education for DM was completed on 1/5. RD spoke with patient and answered any follow up questions. Patient was interested in possible additional education after d/c. RD recommended out patient DM education if able.   Weight Status:  No new weights, 262 lbs on admit.   Nutrition Dx:  Nutrition related knowledge deficit, ongoing  Goal:  Patient will be able to count carbohydrates  Intervention:  Recommend referral to DM outpatient clinic.    Monitor:  Additional education needs   Rudean Haskell Pager #:  [phone]

## 2011-12-27 NOTE — Progress Notes (Addendum)
Inpatient Diabetes Program Recommendations  AACE/ADA: New Consensus Statement on Inpatient Glycemic Control (2009)  Target Ranges:  Prepandial:   less than 140 mg/dL      Peak postprandial:   less than 180 mg/dL (1-2 hours)      Critically ill patients:  140 - 180 mg/dL   Reason: CBGs [id number]  Inpatient Diabetes Program Recommendations Insulin - Basal: Start Lantus 15 units  Note: A1C=11.1 may need insulin for home initially.  Will continue to follow.   Spoke with patient concerning his new diagnosis of DM.  Patient asked appropriate questions and is ready to learn and make lifestyle changes.  Will continue to follow during this admission. Thank you

## 2011-12-27 NOTE — Progress Notes (Signed)
   CARE MANAGEMENT NOTE 12/27/2011  Patient:  Malik Carter, Malik Carter   Account Number:  [id number]  Date Initiated:  12/27/2011  Documentation initiated by:  Letha Cape  Subjective/Objective Assessment:   dx hyperosmolar nonketotic hyperglycemia  admit     Action/Plan:   Anticipated DC Date:  12/29/2011   Anticipated DC Plan:  HOME/SELF CARE      DC Planning Services  CM consult  [facility]  Medication Assistance      Choice offered to / List presented to:             Status of service:  In process, will continue to follow Medicare Important Message given?   (If response is "NO", the following Medicare IM given date fields will be blank) Date Medicare IM given:   Date Additional Medicare IM given:    Discharge Disposition:    Per UR Regulation:    Comments:  PCP [facility] eligiblity apt 1/23 at 2:45, hos f/u apt 2/21 at 2:30 with Dr. Sherron Flemings  12/27/11 16:43 Letha Cape RN, BSN [phone] Patient is eligble for medication ast if needed.

## 2011-12-28 LAB — GLUCOSE, CAPILLARY
Glucose-Capillary: 282 mg/dL — ABNORMAL HIGH (ref 70–99)
Glucose-Capillary: 280 mg/dL — ABNORMAL HIGH (ref 70–99)
Glucose-Capillary: 213 mg/dL — ABNORMAL HIGH (ref 70–99)

## 2011-12-28 MED ORDER — INSULIN GLARGINE 100 UNIT/ML ~~LOC~~ SOLN
20.0000 [IU] | Freq: Every day | SUBCUTANEOUS | Status: DC
  Administered 2011-12-28: 20 [IU] via SUBCUTANEOUS
  Filled 2011-12-28: qty 3

## 2011-12-28 NOTE — Progress Notes (Signed)
Patient ID: Malik Carter, male   DOB: November 24, 1966, 46 y.o.   MRN: [id number] Subjective: Patient seen. Feels better. Denies any nausea or vomiting. No fever, chills or Rigors  Objective: Weight change:  No intake or output data in the 24 hours ending 12/28/11 1220 BP 130/81  Pulse 67  Temp(Src) 98.6 F (37 C) (Oral)  Resp 19  Ht 5\' 11"  (1.803 m)  Wt 119 kg (262 lb 5.6 oz)  BMI 36.59 kg/m2  SpO2 98% Physical Exam: General appearance: alert, cooperative and no distress, mildly dehydrated Head: Normocephalic, without obvious abnormality, atraumatic Neck: no adenopathy, no carotid bruit, no JVD, supple, symmetrical, trachea midline and thyroid not enlarged, symmetric, no tenderness/mass/nodules Lungs: clear to auscultation bilaterally Heart: regular rate and rhythm, S1, S2 normal, no murmur, click, rub or gallop Abdomen: soft, non-tender; bowel sounds normal; no masses,  no organomegaly Extremities: extremities normal, atraumatic, no cyanosis or edema Skin: Slightly decreased turgor.  Lab Results: Results for orders placed during the hospital encounter of 12/24/11 (from the past 48 hour(s))  GLUCOSE, CAPILLARY     Status: Abnormal   Collection Time   12/26/11  4:31 PM      Component Value Range Comment   Glucose-Capillary 301 (*) 70 - 99 (mg/dL)   GLUCOSE, CAPILLARY     Status: Abnormal   Collection Time   12/26/11  5:56 PM      Component Value Range Comment   Glucose-Capillary 250 (*) 70 - 99 (mg/dL)   GLUCOSE, CAPILLARY     Status: Abnormal   Collection Time   12/26/11  9:16 PM      Component Value Range Comment   Glucose-Capillary 367 (*) 70 - 99 (mg/dL)    Comment 1 Notify RN     COMPREHENSIVE METABOLIC PANEL     Status: Abnormal   Collection Time   12/27/11  5:50 AM      Component Value Range Comment   Sodium 138  135 - 145 (mEq/L)    Potassium 3.7  3.5 - 5.1 (mEq/L)    Chloride 105  96 - 112 (mEq/L)    CO2 22  19 - 32 (mEq/L)    Glucose, Bld 236 (*) 70 - 99 (mg/dL)    BUN  14  6 - 23 (mg/dL)    Creatinine, Ser 4.09  0.50 - 1.35 (mg/dL)    Calcium 8.8  8.4 - 10.5 (mg/dL)    Total Protein 6.8  6.0 - 8.3 (g/dL)    Albumin 3.3 (*) 3.5 - 5.2 (g/dL)    AST 25  0 - 37 (U/L)    ALT 50  0 - 53 (U/L)    Alkaline Phosphatase 88  39 - 117 (U/L)    Total Bilirubin 0.6  0.3 - 1.2 (mg/dL)    GFR calc non Af Amer 76 (*) >90 (mL/min)    GFR calc Af Amer 88 (*) >90 (mL/min)   GLUCOSE, CAPILLARY     Status: Abnormal   Collection Time   12/27/11  7:46 AM      Component Value Range Comment   Glucose-Capillary 255 (*) 70 - 99 (mg/dL)   GLUCOSE, CAPILLARY     Status: Abnormal   Collection Time   12/27/11 11:53 AM      Component Value Range Comment   Glucose-Capillary 287 (*) 70 - 99 (mg/dL)   GLUCOSE, CAPILLARY     Status: Abnormal   Collection Time   12/27/11  5:14 PM      Component  Value Range Comment   Glucose-Capillary 189 (*) 70 - 99 (mg/dL)    Comment 1 Notify RN     GLUCOSE, CAPILLARY     Status: Abnormal   Collection Time   12/27/11  9:34 PM      Component Value Range Comment   Glucose-Capillary 223 (*) 70 - 99 (mg/dL)   GLUCOSE, CAPILLARY     Status: Abnormal   Collection Time   12/28/11  7:48 AM      Component Value Range Comment   Glucose-Capillary 282 (*) 70 - 99 (mg/dL)   GLUCOSE, CAPILLARY     Status: Abnormal   Collection Time   12/28/11 12:00 PM      Component Value Range Comment   Glucose-Capillary 280 (*) 70 - 99 (mg/dL)     Micro Results: No results found for this or any previous visit (from the past 240 hour(s)).  Studies/Results: No results found. Medications: Scheduled Meds:   . bd getting started take home kit  1 kit Other Once  . Flexpen Starter Kit  1 kit Other Once  . glyBURIDE  5 mg Oral BID WC  . insulin aspart  0-15 Units Subcutaneous TID WC  . insulin aspart  0-5 Units Subcutaneous QHS  . insulin glargine  20 Units Subcutaneous QHS  . metFORMIN  1,000 mg Oral BID WC  . pneumococcal 23 valent vaccine  0.5 mL Intramuscular  Tomorrow-1000  . DISCONTD: glyBURIDE  2.5 mg Oral BID WC  . DISCONTD: insulin glargine  10 Units Subcutaneous QHS  . DISCONTD: metFORMIN  500 mg Oral BID WC   Continuous Infusions:   . DISCONTD: insulin (NOVOLIN-R) infusion 5.7 Units/hr (12/25/11 1119)   PRN Meds:.acetaminophen, influenza  inactive virus vaccine, ondansetron (ZOFRAN) IV, ondansetron, DISCONTD: dextrose  Assessment/Plan: #1 Type 2 diabetes mellitus with hyperosmolar nonketotic hyperglycemia-new diagnosed diabetes mellitus. Hemoglobin A1c 11.1. Increase Lantus insulin to 20 units subcutaneously daily  #2 Dehydration-improved. We'll encourage by mouth fluid intake.  #3 ARF (acute renal failure)-this is most likely secondary to volume depletion. Resolved now  #4 Hypokalemia-corrected. Diabetic education before discharge.   LOS: 4 days   Angelica Wix 12/28/2011, 12:20 PM

## 2011-12-29 LAB — COMPREHENSIVE METABOLIC PANEL
Alkaline Phosphatase: 91 U/L (ref 39–117)
BUN: 15 mg/dL (ref 6–23)
GFR calc Af Amer: 82 mL/min — ABNORMAL LOW (ref >90)
Glucose, Bld: 217 mg/dL — ABNORMAL HIGH (ref 70–99)
Potassium: 4 mEq/L (ref 3.5–5.1)
Total Bilirubin: 1.1 mg/dL (ref 0.3–1.2)
Total Protein: 7.2 g/dL (ref 6.0–8.3)

## 2011-12-29 LAB — CBC
HCT: 41.2 % (ref 39.0–52.0)
Hemoglobin: 14.4 g/dL (ref 13.0–17.0)
MCHC: 35 g/dL (ref 30.0–36.0)
MCV: 86.6 fL (ref 78.0–100.0)

## 2011-12-29 LAB — GLUCOSE, CAPILLARY: Glucose-Capillary: 129 mg/dL — ABNORMAL HIGH (ref 70–99)

## 2011-12-29 MED ORDER — INSULIN GLARGINE 100 UNIT/ML ~~LOC~~ SOLN
23.0000 [IU] | Freq: Every day | SUBCUTANEOUS | Status: DC
  Administered 2011-12-29 – 2011-12-30 (×2): 23 [IU] via SUBCUTANEOUS
  Filled 2011-12-29: qty 3

## 2011-12-29 NOTE — Progress Notes (Signed)
Patient ID: Malik Carter, male   DOB: [birth year], 47 y.o.   MRN: [id number] Subjective: Denies any nausea or vomiting. Tolerating po well, chart reviewed.  Objective: Weight change:   Intake/Output Summary (Last 24 hours) at 12/29/11 1834 Last data filed at 12/29/11 0919  Gross per 24 hour  Intake    360 ml  Output      3 ml  Net    357 ml   BP 143/97  Pulse 74  Temp(Src) 97.4 F (36.3 C) (Oral)  Resp 20  Ht 5\' 11"  (1.803 m)  Wt 119 kg (262 lb 5.6 oz)  BMI 36.59 kg/m2  SpO2 95% Physical Exam: General appearance: alert, cooperative and no distress, mildly dehydrated Head: Normocephalic, without obvious abnormality, atraumatic Neck: no adenopathy, no carotid bruit, no JVD, supple, symmetrical, trachea midline and thyroid not enlarged, symmetric, no tenderness/mass/nodules Lungs: clear to auscultation bilaterally Heart: regular rate and rhythm, S1, S2 normal, no murmur, click, rub or gallop Abdomen: soft, non-tender; bowel sounds normal; no masses,  no organomegaly Extremities: extremities normal, atraumatic, no cyanosis or edema Skin: Slightly decreased turgor.  Lab Results: Results for orders placed during the hospital encounter of 12/24/11 (from the past 48 hour(s))  GLUCOSE, CAPILLARY     Status: Abnormal   Collection Time   12/27/11  9:34 PM      Component Value Range Comment   Glucose-Capillary 223 (*) 70 - 99 (mg/dL)   GLUCOSE, CAPILLARY     Status: Abnormal   Collection Time   12/28/11  7:48 AM      Component Value Range Comment   Glucose-Capillary 282 (*) 70 - 99 (mg/dL)   GLUCOSE, CAPILLARY     Status: Abnormal   Collection Time   12/28/11 12:00 PM      Component Value Range Comment   Glucose-Capillary 280 (*) 70 - 99 (mg/dL)   GLUCOSE, CAPILLARY     Status: Abnormal   Collection Time   12/28/11  4:49 PM      Component Value Range Comment   Glucose-Capillary 213 (*) 70 - 99 (mg/dL)   GLUCOSE, CAPILLARY     Status: Abnormal   Collection Time   12/28/11  9:32 PM   Component Value Range Comment   Glucose-Capillary 142 (*) 70 - 99 (mg/dL)   CBC     Status: Normal   Collection Time   12/29/11  5:40 AM      Component Value Range Comment   WBC 6.6  4.0 - 10.5 (K/uL)    RBC 4.76  4.22 - 5.81 (MIL/uL)    Hemoglobin 14.4  13.0 - 17.0 (g/dL)    HCT 78.2  95.6 - 21.3 (%)    MCV 86.6  78.0 - 100.0 (fL)    MCH 30.3  26.0 - 34.0 (pg)    MCHC 35.0  30.0 - 36.0 (g/dL)    RDW 08.6  57.8 - 46.9 (%)    Platelets 185  150 - 400 (K/uL)   COMPREHENSIVE METABOLIC PANEL     Status: Abnormal   Collection Time   12/29/11  5:40 AM      Component Value Range Comment   Sodium 137  135 - 145 (mEq/L)    Potassium 4.0  3.5 - 5.1 (mEq/L)    Chloride 103  96 - 112 (mEq/L)    CO2 23  19 - 32 (mEq/L)    Glucose, Bld 217 (*) 70 - 99 (mg/dL)    BUN 15  6 - 23 (mg/dL)  Creatinine, Ser 1.20  0.50 - 1.35 (mg/dL)    Calcium 9.1  8.4 - 10.5 (mg/dL)    Total Protein 7.2  6.0 - 8.3 (g/dL)    Albumin 3.6  3.5 - 5.2 (g/dL)    AST 30  0 - 37 (U/L)    ALT 51  0 - 53 (U/L)    Alkaline Phosphatase 91  39 - 117 (U/L)    Total Bilirubin 1.1  0.3 - 1.2 (mg/dL)    GFR calc non Af Amer 71 (*) >90 (mL/min)    GFR calc Af Amer 82 (*) >90 (mL/min)     Micro Results: No results found for this or any previous visit (from the past 240 hour(s)).  Studies/Results: No results found. Medications: Scheduled Meds:    . glyBURIDE  5 mg Oral BID WC  . insulin aspart  0-15 Units Subcutaneous TID WC  . insulin aspart  0-5 Units Subcutaneous QHS  . insulin glargine  23 Units Subcutaneous QHS  . metFORMIN  1,000 mg Oral BID WC  . pneumococcal 23 valent vaccine  0.5 mL Intramuscular Tomorrow-1000  . DISCONTD: insulin glargine  20 Units Subcutaneous QHS   Continuous Infusions:  PRN Meds:.acetaminophen, ondansetron (ZOFRAN) IV, ondansetron  Assessment/Plan: #1 Type 2 diabetes mellitus with hyperosmolar nonketotic hyperglycemia-new diagnosed diabetes mellitus. - Hemoglobin A1c 11.1.  Will again  Increase Lantus dose insulin to 23 units subcutaneously daily  #2 Dehydration-improved. We'll encourage by mouth fluid intake.  #3 ARF (acute renal failure)-this is most likely prerena.  -Resolved  #4 Hypokalemia-resolved Diabetic education before discharge.   LOS: 5 days   Malik Carter C 12/29/2011, 6:34 PM

## 2011-12-30 DIAGNOSIS — IMO0001 Reserved for inherently not codable concepts without codable children: Secondary | ICD-10-CM | POA: Clinically undetermined

## 2011-12-30 LAB — GLUCOSE, CAPILLARY
Glucose-Capillary: 171 mg/dL — ABNORMAL HIGH (ref 70–99)
Glucose-Capillary: 118 mg/dL — ABNORMAL HIGH (ref 70–99)

## 2011-12-30 MED ORDER — LEVOFLOXACIN 500 MG PO TABS: 500.0000 mg | ORAL_TABLET | Freq: Every day | ORAL | Status: AC

## 2011-12-30 MED ORDER — METFORMIN HCL 1000 MG PO TABS: 1000.0000 mg | ORAL_TABLET | Freq: Two times a day (BID) | ORAL | Status: DC

## 2011-12-30 MED ORDER — GLYBURIDE 5 MG PO TABS: 5.0000 mg | ORAL_TABLET | Freq: Two times a day (BID) | ORAL | Status: DC

## 2011-12-30 MED ORDER — INSULIN GLARGINE 100 UNIT/ML ~~LOC~~ SOLN: 23.0000 [IU] | Freq: Every day | SUBCUTANEOUS | Status: DC

## 2011-12-30 MED ORDER — LEVOFLOXACIN 500 MG PO TABS
500.0000 mg | ORAL_TABLET | Freq: Every day | ORAL | Status: DC
  Administered 2011-12-30 – 2011-12-31 (×2): 500 mg via ORAL
  Filled 2011-12-30 (×2): qty 1

## 2011-12-30 MED ORDER — ACETAMINOPHEN 500 MG PO TABS: 500.0000 mg | ORAL_TABLET | Freq: Four times a day (QID) | ORAL | Status: AC | PRN

## 2011-12-30 NOTE — Progress Notes (Signed)
   CARE MANAGEMENT NOTE 12/30/2011  Patient:  Malik Carter, Malik Carter   Account Number:  [id number]  Date Initiated:  12/27/2011  Documentation initiated by:  Letha Cape  Subjective/Objective Assessment:   dx hyperosmolar nonketotic hyperglycemia  admit     Action/Plan:   Anticipated DC Date:  12/29/2011   Anticipated DC Plan:  HOME/SELF CARE      DC Planning Services  CM consult  [facility]igent [facility] Clinic  Medication Assistance      Choice offered to / List presented to:             Status of service:  Completed, signed off Medicare Important Message given?   (If response is "NO", the following Medicare IM given date fields will be blank) Date Medicare IM given:   Date Additional Medicare IM given:    Discharge Disposition:  HOME/SELF CARE  Per UR Regulation:  Reviewed for med. necessity/level of care/duration of stay  Comments:  PCP [facility] eligiblity apt 1/23 at 2:45, hos f/u apt 2/21 at 2:30 with Dr. Sherron Flemings  12/30/11- 1645- Malik Pierini RN, BSN [phone] Pt for discharge - assisted with medications through indigent fund for po levequin and lantus, other po glucophage and diabeta pt can get for $4 from [facility]. Spoke with pt and pt states that he afford the $8 for the other 2 prescriptions. Pharmacy to call floor RN when meds ready.  12/27/11 16:43 Letha Cape RN, BSN [phone] Patient is eligble for medication ast if needed.

## 2011-12-30 NOTE — Progress Notes (Signed)
Utilization review complete 

## 2011-12-30 NOTE — Discharge Summary (Signed)
Name: Malik Carter MRN: [id number] DOB: 10/02/1966 46 y.o.  Date of Admission: 12/24/2011  5:43 PM Date of Discharge: 12/30/2011 Attending Physician: Kela Millin  Discharge Diagnosis: Principal Problem:  *Type 2 diabetes mellitus with hyperosmolar nonketotic hyperglycemia Active Problems:  Dehydration  ARF (acute renal failure)  Hypokalemia  Phimosis/adherent prepuce   Discharge Medications: Current Discharge Medication List    START taking these medications   Details  acetaminophen (TYLENOL) 500 MG tablet Take 1 tablet (500 mg total) by mouth every 6 (six) hours as needed for pain.    glyBURIDE (DIABETA) 5 MG tablet Take 1 tablet (5 mg total) by mouth 2 (two) times daily with a meal. Qty: 60 tablet, Refills: 0    insulin glargine (LANTUS) 100 UNIT/ML injection Inject 23 Units into the skin at bedtime. Qty: 3 mL, Refills: 0    levofloxacin (LEVAQUIN) 500 MG tablet Take 1 tablet (500 mg total) by mouth daily. Qty: 10 tablet, Refills: 0    metFORMIN (GLUCOPHAGE) 1000 MG tablet Take 1 tablet (1,000 mg total) by mouth 2 (two) times daily with a meal. Qty: 60 tablet, Refills: 0      STOP taking these medications     HYDROcodone-acetaminophen (NORCO) 5-325 MG per tablet      penicillin v potassium (VEETID) 500 MG tablet      clindamycin (CLEOCIN) 150 MG capsule         Disposition and follow-up:   Mr.Malik Carter was discharged from [facility] in improved/stable condition.    Follow-up Appointments: Discharge Orders    Future Orders Please Complete By Expires   Diet Carb Modified      Increase activity slowly         Consultations:    Procedures Performed:  No results found. Brief history  Patient is a 46 YO with no significant PMHx except for recent Tooth abscess here with polydipsia, polyphagia and weakness. Patient found to have a CBG of over 800. Has no prior history of DM. His father has Diabetes. Symptoms had been going on for the  week prior to admission. No NVD but mild abdominal discomfort. No fever, no dizziness. Patient slightly nauseated but vomiting.  physical exam  General appearance: alert, cooperative and no distress, mildly dehydrated  Head: Normocephalic, without obvious abnormality, atraumatic  Neck: no adenopathy, no carotid bruit, no JVD, supple, symmetrical, trachea midline and thyroid not enlarged, symmetric, no tenderness/mass/nodules  Lungs: clear to auscultation bilaterally  Heart: regular rate and rhythm, S1, S2 normal, no murmur, click, rub or gallop  Abdomen: soft, non-tender; bowel sounds normal; no masses, no organomegaly  GU-foreskin over penis mildly edematous, nonretractable, no discharge, mildly tender, no erythema. Extremities: extremities normal, atraumatic, no cyanosis or edema     Hospital Course by problem list: Principal Problem:  *Type 2 diabetes mellitus with hyperosmolar nonketotic hyperglycemia Active Problems:  Dehydration  ARF (acute renal failure)  Hypokalemia  Phimosis/adherent prepuce  #1 Type 2 diabetes mellitus with hyperosmolar nonketotic hyperglycemia-new diagnosed diabetes mellitus. Upon admission the patient was started on IV insulin via R. glucostabilzer arm and Accu-Cheks were monitored. when he met criteria the was transitioned to Lantus and the glucostabillizer was discontinued. With continued Accu-Chek monitoring glyburide and metformin were added, and the Lantus was titrated up for better blood glucose control. The patient received diabetes education arm while in the hospital and also instructions on administering Lantus and checking his blood sugars. The patient will also be scheduled for outpatient diabetes education.His Hemoglobin  A1c was done -11.1., Lifestyle changes were emphasized and the dietary was consulted and patient was also educated on his diet. His blood sugars have been better controlled -mostly 118 to 171 range the past 24 hours. He'll be  discharged home at this time and is to follow up outpatient the Will again Increase Lantus dose insulin to 23 units subcutaneously daily  #2 Dehydration-improved. This resolved with IV fluids, by mouth fluid intake was also.  #3 ARF (acute renal failure)- this resolved with hydration, and the impression was that it was prerenal. His to follow up outpatient the #4 Hypokalemia-his potassium was replaced in the hospital #5 Phimosis- the patient complained of inability to retract the foreskin on his penis his while in the hospital, and on exam he was noted mildly tender over his prepuce,with mild edema of the fore skin. He was started on empiric antibiotics with Levaquin, and instructed to use warm soaks and Tylenol when necessary(NSAIDs avoided as the patient was admitted with a acute renal insufficiency although now resolved). His to followup with his PCP at [facility] outpatient. He was afebrile with no leukocytosis upon discharge and hemodynamically.  Discharge Vitals:  BP 135/90  Pulse 79  Temp(Src) 97.2 F (36.2 C) (Oral)  Resp 18  Ht 5\' 11"  (1.803 m)  Wt 119 kg (262 lb 5.6 oz)  BMI 36.59 kg/m2  SpO2 95%  Discharge Labs:  Results for orders placed during the hospital encounter of 12/24/11 (from the past 24 hour(s))  GLUCOSE, CAPILLARY     Status: Abnormal   Collection Time   12/29/11  5:02 PM      Component Value Range   Glucose-Capillary 125 (*) 70 - 99 (mg/dL)   Comment 1 Documented in Chart    GLUCOSE, CAPILLARY     Status: Abnormal   Collection Time   12/29/11  9:45 PM      Component Value Range   Glucose-Capillary 129 (*) 70 - 99 (mg/dL)  GLUCOSE, CAPILLARY     Status: Abnormal   Collection Time   12/30/11  8:10 AM      Component Value Range   Glucose-Capillary 153 (*) 70 - 99 (mg/dL)  GLUCOSE, CAPILLARY     Status: Abnormal   Collection Time   12/30/11 11:57 AM      Component Value Range   Glucose-Capillary 171 (*) 70 - 99 (mg/dL)    Signed: Kela Millin 12/30/2011,  4:32 PM

## 2011-12-31 LAB — BASIC METABOLIC PANEL
BUN: 16 mg/dL (ref 6–23)
Calcium: 9.6 mg/dL (ref 8.4–10.5)
Creatinine, Ser: 1.33 mg/dL (ref 0.50–1.35)
GFR calc non Af Amer: 63 mL/min — ABNORMAL LOW (ref >90)
Glucose, Bld: 138 mg/dL — ABNORMAL HIGH (ref 70–99)

## 2011-12-31 NOTE — Progress Notes (Signed)
Patient ID: Malik Carter, male   DOB: [birth year], 46 y.o.   MRN: [id number] Subjective: Per nsg staff admission held last pm b/c pt panicked when last min. He had to be taught to administer insulin from vial(provided by [employer]),and with that was nauseous/vomited. Denies any further nausea or vomiting today. Tolerating po well, feels much better and also comfortable administering his insulin from vial  Objective: Weight change:   Intake/Output Summary (Last 24 hours) at 12/31/11 1249 Last data filed at 12/31/11 0856  Gross per 24 hour  Intake   1200 ml  Output      2 ml  Net   1198 ml   BP 132/82  Pulse 73  Temp(Src) 97.3 F (36.3 C) (Oral)  Resp 20  Ht 5\' 11"  (1.803 m)  Wt 119 kg (262 lb 5.6 oz)  BMI 36.59 kg/m2  SpO2 96% Physical Exam: General appearance: alert, cooperative and no distress, mildly dehydrated Head: Normocephalic, without obvious abnormality, atraumatic Neck: no adenopathy, no carotid bruit, no JVD, supple, symmetrical, trachea midline and thyroid not enlarged, symmetric, no tenderness/mass/nodules Lungs: clear to auscultation bilaterally Heart: regular rate and rhythm, S1, S2 normal, no murmur, click, rub or gallop Abdomen: soft, non-tender; bowel sounds normal; no masses,  no organomegaly Extremities: extremities normal, atraumatic, no cyanosis or edema Skin: Slightly decreased turgor.  Lab Results: Results for orders placed during the hospital encounter of 12/24/11 (from the past 48 hour(s))  GLUCOSE, CAPILLARY     Status: Abnormal   Collection Time   12/29/11  5:02 PM      Component Value Range Comment   Glucose-Capillary 125 (*) 70 - 99 (mg/dL)    Comment 1 Documented in Chart     GLUCOSE, CAPILLARY     Status: Abnormal   Collection Time   12/29/11  9:45 PM      Component Value Range Comment   Glucose-Capillary 129 (*) 70 - 99 (mg/dL)   GLUCOSE, CAPILLARY     Status: Abnormal   Collection Time   12/30/11  8:10 AM      Component Value Range Comment     Glucose-Capillary 153 (*) 70 - 99 (mg/dL)   GLUCOSE, CAPILLARY     Status: Abnormal   Collection Time   12/30/11 11:57 AM      Component Value Range Comment   Glucose-Capillary 171 (*) 70 - 99 (mg/dL)   GLUCOSE, CAPILLARY     Status: Abnormal   Collection Time   12/30/11  5:07 PM      Component Value Range Comment   Glucose-Capillary 118 (*) 70 - 99 (mg/dL)   GLUCOSE, CAPILLARY     Status: Abnormal   Collection Time   12/30/11  7:19 PM      Component Value Range Comment   Glucose-Capillary 145 (*) 70 - 99 (mg/dL)   GLUCOSE, CAPILLARY     Status: Abnormal   Collection Time   12/30/11  9:28 PM      Component Value Range Comment   Glucose-Capillary 161 (*) 70 - 99 (mg/dL)   BASIC METABOLIC PANEL     Status: Abnormal   Collection Time   12/31/11  4:50 AM      Component Value Range Comment   Sodium 138  135 - 145 (mEq/L)    Potassium 4.1  3.5 - 5.1 (mEq/L)    Chloride 104  96 - 112 (mEq/L)    CO2 24  19 - 32 (mEq/L)    Glucose, Bld 138 (*) 70 -  99 (mg/dL)    BUN 16  6 - 23 (mg/dL)    Creatinine, Ser 1.61  0.50 - 1.35 (mg/dL)    Calcium 9.6  8.4 - 10.5 (mg/dL)    GFR calc non Af Amer 63 (*) >90 (mL/min)    GFR calc Af Amer 73 (*) >90 (mL/min)   GLUCOSE, CAPILLARY     Status: Abnormal   Collection Time   12/31/11  7:52 AM      Component Value Range Comment   Glucose-Capillary 139 (*) 70 - 99 (mg/dL)   GLUCOSE, CAPILLARY     Status: Abnormal   Collection Time   12/31/11 12:04 PM      Component Value Range Comment   Glucose-Capillary 185 (*) 70 - 99 (mg/dL)     Micro Results: No results found for this or any previous visit (from the past 240 hour(s)).  Studies/Results: No results found. Medications: Scheduled Meds:    . glyBURIDE  5 mg Oral BID WC  . insulin aspart  0-15 Units Subcutaneous TID WC  . insulin aspart  0-5 Units Subcutaneous QHS  . insulin glargine  23 Units Subcutaneous QHS  . levofloxacin  500 mg Oral Daily  . metFORMIN  1,000 mg Oral BID WC    Continuous Infusions:  PRN Meds:.acetaminophen, ondansetron (ZOFRAN) IV, ondansetron  Assessment/Plan: #1 Type 2 diabetes mellitus with hyperosmolar nonketotic hyperglycemia-new diagnosed diabetes mellitus. - Hemoglobin A1c 11.1.  BG control much improved on current regimen #2 Dehydration-improved. We'll encourage by mouth fluid intake.  #3 ARF (acute renal failure)-this is most likely prerena.  -Resolved  #4 Hypokalemia-resolved #5phimosis - continue levaquin, warm soaks  Pt medically stable for d/c this am, f/[facility] outpt  at [facility]- please see d/c of 1/10, no changes overnight.   LOS: 7 days   Ahmiya Abee C 12/31/2011, 12:49 PM

## 2011-12-31 NOTE — Progress Notes (Signed)
Patient feeling better today and understands Insulin administration. No questions or concerns verbalized. Patient  Skin intact

## 2012-01-28 ENCOUNTER — Other Ambulatory Visit: Payer: Self-pay | Admitting: Internal Medicine

## 2013-08-28 ENCOUNTER — Encounter (HOSPITAL_COMMUNITY): Payer: Self-pay | Admitting: *Deleted

## 2013-08-28 ENCOUNTER — Emergency Department (HOSPITAL_COMMUNITY)
Admission: EM | Admit: 2013-08-28 | Discharge: 2013-08-28 | Discharge status: Against Medical Advice | Payer: Self-pay | Attending: Emergency Medicine | Admitting: Emergency Medicine

## 2013-08-28 DIAGNOSIS — E119 Type 2 diabetes mellitus without complications: Secondary | ICD-10-CM | POA: Insufficient documentation

## 2013-08-28 DIAGNOSIS — F17200 Nicotine dependence, unspecified, uncomplicated: Secondary | ICD-10-CM | POA: Insufficient documentation

## 2013-08-28 DIAGNOSIS — R109 Unspecified abdominal pain: Secondary | ICD-10-CM | POA: Insufficient documentation

## 2013-08-28 HISTORY — DX: Type 2 diabetes mellitus without complications: E11.9

## 2013-08-28 LAB — POCT I-STAT TROPONIN I: Troponin i, poc: 0 ng/mL (ref 0.00–0.08)

## 2013-08-28 LAB — CBC WITH DIFFERENTIAL/PLATELET
Basophils Absolute: 0 10*3/uL (ref 0.0–0.1)
Lymphocytes Relative: 25 % (ref 12–46)
Lymphs Abs: 1.2 10*3/uL (ref 0.7–4.0)
Neutro Abs: 3.2 10*3/uL (ref 1.7–7.7)
Neutrophils Relative %: 69 % (ref 43–77)
Platelets: 167 10*3/uL (ref 150–400)
RBC: 4.39 MIL/uL (ref 4.22–5.81)
RDW: 12.9 % (ref 11.5–15.5)
WBC: 4.6 10*3/uL (ref 4.0–10.5)

## 2013-08-28 LAB — COMPREHENSIVE METABOLIC PANEL
Albumin: 4.3 g/dL (ref 3.5–5.2)
BUN: 12 mg/dL (ref 6–23)
CO2: 23 mEq/L (ref 19–32)
Chloride: 105 mEq/L (ref 96–112)
Creatinine, Ser: 1.48 mg/dL — ABNORMAL HIGH (ref 0.50–1.35)
GFR calc Af Amer: 63 mL/min — ABNORMAL LOW (ref >90)
GFR calc non Af Amer: 54 mL/min — ABNORMAL LOW (ref >90)
Glucose, Bld: 148 mg/dL — ABNORMAL HIGH (ref 70–99)
Total Bilirubin: 0.9 mg/dL (ref 0.3–1.2)

## 2013-08-28 LAB — LIPASE, BLOOD: Lipase: 33 U/L (ref 11–59)

## 2013-08-28 NOTE — ED Notes (Signed)
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  Pt was hoping it would pass.  Pt states drank some alcohol.  No nvd

## 2013-08-28 NOTE — ED Notes (Signed)
Called for pt, no answer

## 2014-02-11 ENCOUNTER — Emergency Department (HOSPITAL_COMMUNITY)
Admission: EM | Admit: 2014-02-11 | Discharge: 2014-02-11 | Disposition: A | Discharge status: Home / Self Care | Payer: Self-pay | Attending: Emergency Medicine | Admitting: Emergency Medicine

## 2014-02-11 ENCOUNTER — Encounter (HOSPITAL_COMMUNITY): Payer: Self-pay | Admitting: Emergency Medicine

## 2014-02-11 DIAGNOSIS — F17200 Nicotine dependence, unspecified, uncomplicated: Secondary | ICD-10-CM | POA: Insufficient documentation

## 2014-02-11 DIAGNOSIS — K029 Dental caries, unspecified: Secondary | ICD-10-CM | POA: Insufficient documentation

## 2014-02-11 DIAGNOSIS — K089 Disorder of teeth and supporting structures, unspecified: Secondary | ICD-10-CM | POA: Insufficient documentation

## 2014-02-11 DIAGNOSIS — E119 Type 2 diabetes mellitus without complications: Secondary | ICD-10-CM | POA: Insufficient documentation

## 2014-02-11 DIAGNOSIS — K047 Periapical abscess without sinus: Secondary | ICD-10-CM | POA: Insufficient documentation

## 2014-02-11 LAB — CBG MONITORING, ED: Glucose-Capillary: 195 mg/dL — ABNORMAL HIGH (ref 70–99)

## 2014-02-11 MED ORDER — AMOXICILLIN 500 MG PO CAPS: 500.0000 mg | ORAL_CAPSULE | Freq: Three times a day (TID) | ORAL | Status: DC

## 2014-02-11 MED ORDER — TRAMADOL HCL 50 MG PO TABS: 50.0000 mg | ORAL_TABLET | Freq: Four times a day (QID) | ORAL | Status: DC | PRN

## 2014-02-11 NOTE — Discharge Instructions (Signed)
Abscessed Tooth An abscessed tooth is an infection around your tooth. It may be caused by holes or damage to the tooth (cavity) or a dental disease. An abscessed tooth causes mild to very bad pain in and around the tooth. See your dentist right away if you have tooth or gum pain. HOME CARE  Take your medicine as told. Finish it even if you start to feel better.  Do not drive after taking pain medicine.  Rinse your mouth (gargle) often with salt water ( teaspoon salt in 8 ounces of warm water).  Do not apply heat to the outside of your face. GET HELP RIGHT AWAY IF:   You have a temperature by mouth above 102 F (38.9 C), not controlled by medicine.  You have chills and a very bad headache.  You have problems breathing or swallowing.  Your mouth will not open.  You develop puffiness (swelling) on the neck or around the eye.  Your pain is not helped by medicine.  Your pain is getting worse instead of better. MAKE SURE YOU:   Understand these instructions.  Will watch your condition.  Will get help right away if you are not doing well or get worse. Document Released: 05/24/2008 Document Revised: 02/28/2012 Document Reviewed: 03/16/2011 [facility] Patient Information 2014 [place].  Dental Caries  Dental caries (also called tooth decay) is the most common oral disease. It can occur at any age, but is more common in children and young adults.  HOW DENTAL CARIES DEVELOPS  The process of decay begins when bacteria and foods (particularly sugars and starches) combine in your mouth to produce plaque. Plaque is a substance that sticks to the hard, outer surface of a tooth (enamel). The bacteria in plaque produce acids that attack enamel. These acids may also attack the root surface of a tooth (cementum) if it is exposed. Repeated attacks dissolve these surfaces and create holes in the tooth (cavities). If left untreated, the acids destroy the other layers of the tooth.  RISK  FACTORS  Frequent sipping of sugary beverages.   Frequent snacking on sugary and starchy foods, especially those that easily get stuck in the teeth.   Poor oral hygiene.   Dry mouth.   Substance abuse such as methamphetamine abuse.   Broken or poor-fitting dental restorations.   Eating disorders.   Gastroesophageal reflux disease (GERD).   Certain radiation treatments to the head and neck. SYMPTOMS In the early stages of dental caries, symptoms are seldom present. Sometimes white, chalky areas may be seen on the enamel or other tooth layers. In later stages, symptoms may include:  Pits and holes on the enamel.  Toothache after sweet, hot, or cold foods or drinks are consumed.  Pain around the tooth.  Swelling around the tooth. DIAGNOSIS  Most of the time, dental caries is detected during a regular dental checkup. A diagnosis is made after a thorough medical and dental history is taken and the surfaces of your teeth are checked for signs of dental caries. Sometimes special instruments, such as lasers, are used to check for dental caries. Dental X-ray exams may be taken so that areas not visible to the eye (such as between the contact areas of the teeth) can be checked for cavities.  TREATMENT  If dental caries is in its early stages, it may be reversed with a fluoride treatment or an application of a remineralizing agent at the dental office. Thorough brushing and flossing at home is needed to aid these  treatments. If it is in its later stages, treatment depends on the location and extent of tooth destruction:   If a small area of the tooth has been destroyed, the destroyed area will be removed and cavities will be filled with a material such as gold, silver amalgam, or composite resin.   If a large area of the tooth has been destroyed, the destroyed area will be removed and a cap (crown) will be fitted over the remaining tooth structure.   If the center part of the  tooth (pulp) is affected, a procedure called a root canal will be needed before a filling or crown can be placed.   If most of the tooth has been destroyed, the tooth may need to be pulled (extracted). HOME CARE INSTRUCTIONS You can prevent, stop, or reverse dental caries at home by practicing good oral hygiene. Good oral hygiene includes:  Thoroughly cleaning your teeth at least twice a day with a toothbrush and dental floss.   Using a fluoride toothpaste. A fluoride mouth rinse may also be used if recommended by your dentist or health care provider.   Restricting the amount of sugary and starchy foods and sugary liquids you consume.   Avoiding frequent snacking on these foods and sipping of these liquids.   Keeping regular visits with a dentist for checkups and cleanings. PREVENTION   Practice good oral hygiene.  Consider a dental sealant. A dental sealant is a coating material that is applied by your dentist to the pits and grooves of teeth. The sealant prevents food from being trapped in them. It may protect the teeth for several years.  Ask about fluoride supplements if you live in a community without fluorinated water or with water that has a low fluoride content. Use fluoride supplements as directed by your dentist or health care provider.  Allow fluoride varnish applications to teeth if directed by your dentist or health care provider. Document Released: 08/28/2002 Document Revised: 08/08/2013 Document Reviewed: 12/08/2012 [facility] Patient Information 2014 [place]

## 2014-02-11 NOTE — ED Provider Notes (Signed)
CSN: [id number]     Arrival date & time 02/11/14  2025 History   This chart was scribed for Junius Finner, PA-C, non-physician practitioner working with Celene Kras, MD by Nicholos Johns, ED scribe. This patient was seen in room TR11C/TR11C and the patient's care was started at 10:36 PM.   Chief Complaint  Patient presents with  . Abscess   The history is provided by the patient. No language interpreter was used.   HPI Comments: Malik Carter is a 48 y.o. male who presents to the Emergency Department complaining of a painful dental abscess and right lower jaw swelling, onset 2-3 days ago. Pain was worse on arrival but states it is now 5/10. States he had something similar to this 1 year ago and was prescribed Amoxicillin. States the antibiotic worked and the abscess went away w/o I&D. Pt states he does have a dentist. Denies trouble breathing or swallowing.  Denies fever, n/v/d.  Past Medical History  Diagnosis Date  . Diabetes mellitus without complication     not taking insulin for a while   History reviewed. No pertinent past surgical history. Family History  Problem Relation Age of Onset  . Diabetes Father    History  Substance Use Topics  . Smoking status: Current Every Day Smoker  . Smokeless tobacco: Never Used  . Alcohol Use: Yes    Review of Systems  HENT: Positive for dental problem. Negative for trouble swallowing.   Respiratory: Negative for shortness of breath.   All other systems reviewed and are negative.   Allergies  Review of patient's allergies indicates no known allergies.  Home Medications   Current Outpatient Rx  Name  Route  Sig  Dispense  Refill  . amoxicillin (AMOXIL) 500 MG capsule   Oral   Take 1 capsule (500 mg total) by mouth 3 (three) times daily.   21 capsule   0   . traMADol (ULTRAM) 50 MG tablet   Oral   Take 1 tablet (50 mg total) by mouth every 6 (six) hours as needed.   15 tablet   0    Triage Vitals: BP 119/100  Pulse 81   Temp(Src) 98.6 F (37 C) (Oral)  Resp 16  Wt 246 lb 4 oz (111.698 kg)  SpO2 98%  Physical Exam  Nursing note and vitals reviewed. Constitutional: He is oriented to person, place, and time. He appears well-developed and well-nourished.  HENT:  Head: Normocephalic and atraumatic.  Mouth/Throat: Uvula is midline, oropharynx is clear and moist and mucous membranes are normal. No trismus in the jaw. Abnormal dentition. Dental abscesses and dental caries present. No tonsillar abscesses.  Multiple dental caries with silver cap to right lower last molar. Edema and induration to right lower cheek over mandible with tenderness. Tenderness in right lower buccal mucosa and gingiva. No discharge or bleeding.  Eyes: EOM are normal.  Neck: Normal range of motion.  Cardiovascular: Normal rate.   Pulmonary/Chest: Effort normal.  Musculoskeletal: Normal range of motion.  Neurological: He is alert and oriented to person, place, and time.  Skin: Skin is warm and dry.  Psychiatric: He has a normal mood and affect. His behavior is normal.   ED Course  Procedures (including critical care time) DIAGNOSTIC STUDIES: Oxygen Saturation is 98% on room air, normal by my interpretation.    COORDINATION OF CARE: At 10:37 PM: Discussed treatment plan with patient which includes a prescription for Amoxicillin and pain medication. Patient agrees.   Labs Review Labs  Reviewed  CBG MONITORING, ED - Abnormal; Notable for the following:    Glucose-Capillary 195 (*)    All other components within normal limits   Imaging Review No results found.  EKG Interpretation   None       MDM   Final diagnoses:  Dental abscess  Pain due to dental caries   Pt is a 48yo male with hx of dental caries and dental abscess presenting with similar symptoms x2-3 days. Denies difficulty breathing or swallowing. Denies fever, n/v/d. Reports taking amoxicillin in past with significant relief. Pt does have a dentist. On exam,  likely dental abscess in right lower cheek. No obvious area for successful I&D.  Will tx with amoxicillin and tramadol. Also discussed use of warm compresses. Advised to f/u with dentist in 1-2 weeks for recheck. Return precautions provided. Pt verbalized understanding and agreement with tx plan.   I personally performed the services described in this documentation, which was scribed in my presence. The recorded information has been reviewed and is accurate.      Junius Finnerrin O'Malley, PA-C 02/12/14 1553

## 2014-02-11 NOTE — ED Notes (Signed)
Presents with right lower jaw swelling began a few days ago. Also reports being out of DM medications. Worried his sugar was high. CBG 195 here. Pt is alert, oriented with airway intact. Denies fevers. Reports having jaw sweliing before and being placed on amoxicillin which took care of the infection.

## 2014-02-15 NOTE — ED Provider Notes (Signed)
Medical screening examination/treatment/procedure(s) were performed by non-physician practitioner and as supervising physician I was immediately available for consultation/collaboration.    Edelmiro Innocent R Leslea Vowles, MD 02/15/14 0853 

## 2016-05-13 ENCOUNTER — Encounter (HOSPITAL_COMMUNITY): Payer: Self-pay | Admitting: Neurology

## 2016-05-13 ENCOUNTER — Emergency Department (HOSPITAL_COMMUNITY)
Admission: EM | Admit: 2016-05-13 | Discharge: 2016-05-13 | Disposition: A | Discharge status: Home / Self Care | Payer: Self-pay | Attending: Emergency Medicine | Admitting: Emergency Medicine

## 2016-05-13 DIAGNOSIS — R739 Hyperglycemia, unspecified: Secondary | ICD-10-CM

## 2016-05-13 DIAGNOSIS — E1165 Type 2 diabetes mellitus with hyperglycemia: Secondary | ICD-10-CM | POA: Insufficient documentation

## 2016-05-13 DIAGNOSIS — Z87891 Personal history of nicotine dependence: Secondary | ICD-10-CM | POA: Insufficient documentation

## 2016-05-13 DIAGNOSIS — Z9114 Patient's other noncompliance with medication regimen: Secondary | ICD-10-CM | POA: Insufficient documentation

## 2016-05-13 LAB — BASIC METABOLIC PANEL
Anion gap: 7 (ref 5–15)
BUN: 16 mg/dL (ref 6–20)
CO2: 24 mmol/L (ref 22–32)
CREATININE: 1.33 mg/dL — AB (ref 0.61–1.24)
Calcium: 9.2 mg/dL (ref 8.9–10.3)
Chloride: 101 mmol/L (ref 101–111)
GFR calc Af Amer: >60 mL/min (ref >60)
Glucose, Bld: 539 mg/dL — ABNORMAL HIGH (ref 65–99)
Potassium: 4.4 mmol/L (ref 3.5–5.1)
SODIUM: 132 mmol/L — AB (ref 135–145)

## 2016-05-13 LAB — URINALYSIS, ROUTINE W REFLEX MICROSCOPIC
BILIRUBIN URINE: NEGATIVE
Hgb urine dipstick: NEGATIVE
KETONES UR: NEGATIVE mg/dL
LEUKOCYTES UA: NEGATIVE
NITRITE: NEGATIVE
PROTEIN: NEGATIVE mg/dL
Specific Gravity, Urine: 1.028 (ref 1.005–1.030)
pH: 6.5 (ref 5.0–8.0)

## 2016-05-13 LAB — CBC
HCT: 41.4 % (ref 39.0–52.0)
Hemoglobin: 14.4 g/dL (ref 13.0–17.0)
MCH: 29.9 pg (ref 26.0–34.0)
MCHC: 34.8 g/dL (ref 30.0–36.0)
MCV: 86.1 fL (ref 78.0–100.0)
PLATELETS: 162 10*3/uL (ref 150–400)
RBC: 4.81 MIL/uL (ref 4.22–5.81)
RDW: 12.1 % (ref 11.5–15.5)
WBC: 4.7 10*3/uL (ref 4.0–10.5)

## 2016-05-13 LAB — CBG MONITORING, ED
GLUCOSE-CAPILLARY: 593 mg/dL — AB (ref 65–99)
Glucose-Capillary: 511 mg/dL — ABNORMAL HIGH (ref 65–99)

## 2016-05-13 LAB — URINE MICROSCOPIC-ADD ON
Bacteria, UA: NONE SEEN
WBC, UA: NONE SEEN WBC/hpf (ref 0–5)

## 2016-05-13 LAB — I-STAT CHEM 8, ED
BUN: 20 mg/dL (ref 6–20)
CALCIUM ION: 1.18 mmol/L (ref 1.12–1.23)
CREATININE: 1.3 mg/dL — AB (ref 0.61–1.24)
Chloride: 99 mmol/L — ABNORMAL LOW (ref 101–111)
Glucose, Bld: 531 mg/dL — ABNORMAL HIGH (ref 65–99)
HEMATOCRIT: 48 % (ref 39.0–52.0)
HEMOGLOBIN: 16.3 g/dL (ref 13.0–17.0)
Potassium: 4.3 mmol/L (ref 3.5–5.1)
SODIUM: 136 mmol/L (ref 135–145)
TCO2: 25 mmol/L (ref 0–100)

## 2016-05-13 MED ORDER — METFORMIN HCL 500 MG PO TABS: 500.0000 mg | ORAL_TABLET | Freq: Two times a day (BID) | ORAL | Status: DC

## 2016-05-13 MED ORDER — SODIUM CHLORIDE 0.9 % IV BOLUS (SEPSIS)
1000.0000 mL | Freq: Once | INTRAVENOUS | Status: AC
  Administered 2016-05-13: 1000 mL via INTRAVENOUS

## 2016-05-13 MED ORDER — METFORMIN HCL 500 MG PO TABS
500.0000 mg | ORAL_TABLET | Freq: Once | ORAL | Status: AC
Start: 2016-05-13 — End: 2016-05-13
  Administered 2016-05-13: 500 mg via ORAL
  Filled 2016-05-13: qty 1

## 2016-05-13 NOTE — ED Provider Notes (Signed)
CSN: [id number]     Arrival date & time 05/13/16  0801 History   First MD Initiated Contact with Patient 05/13/16 0805     Chief Complaint  Patient presents with  . Hyperglycemia     (Consider location/radiation/quality/duration/timing/severity/associated sxs/prior Treatment) HPI Comments: Patient presents with worsening symptoms of excessive thirst and urination, progressive over the last one month. He states he was prescribed Metformin at 1000 mg twice daily and his blood sugar was found to be significantly low on multiple occasions and the patient decreased his dose by 1/2. He ran out of medication one month ago and has developed current symptoms during that time. No nausea, vomiting, diarrhea or fever. He denies abdominal pain.  Patient is a 50 y.o. male presenting with hyperglycemia. The history is provided by the patient. No language interpreter was used.  Hyperglycemia Blood sugar level PTA:  Unknown Onset quality:  Gradual Duration:  1 month Progression:  Worsening Diabetes status:  Controlled with oral medications Current diabetic therapy:  Metformin - has been out for one month Time since last antidiabetic medication:  1 month Associated symptoms: no abdominal pain, no diaphoresis, no fever, no nausea, no shortness of breath and no vomiting     Past Medical History  Diagnosis Date  . Diabetes mellitus without complication (HCC)     not taking insulin for a while   History reviewed. No pertinent past surgical history. Family History  Problem Relation Age of Onset  . Diabetes Father    Social History  Substance Use Topics  . Smoking status: Former Games developer  . Smokeless tobacco: Never Used  . Alcohol Use: No    Review of Systems  Constitutional: Negative for fever, chills and diaphoresis.  Respiratory: Negative.  Negative for shortness of breath.   Cardiovascular: Negative.   Gastrointestinal: Negative.  Negative for nausea, vomiting, abdominal pain and diarrhea.   Musculoskeletal: Negative.   Skin: Negative.   Neurological: Negative.       Allergies  Review of patient's allergies indicates no known allergies.  Home Medications   Prior to Admission medications   Medication Sig Start Date End Date Taking? Authorizing Provider  amoxicillin (AMOXIL) 500 MG capsule Take 1 capsule (500 mg total) by mouth 3 (three) times daily. 02/11/14   Junius Finner, PA-C  traMADol (ULTRAM) 50 MG tablet Take 1 tablet (50 mg total) by mouth every 6 (six) hours as needed. 02/11/14   Junius Finner, PA-C   BP 136/86 mmHg  Pulse 69  Temp(Src) 98.3 F (36.8 C) (Oral)  Resp 18  SpO2 99% Physical Exam  Constitutional: He is oriented to person, place, and time. He appears well-developed and well-nourished.  HENT:  Head: Normocephalic.  Neck: Normal range of motion. Neck supple.  Cardiovascular: Normal rate and regular rhythm.   Pulmonary/Chest: Effort normal and breath sounds normal.  Abdominal: Soft. Bowel sounds are normal. There is no tenderness. There is no rebound and no guarding.  Musculoskeletal: Normal range of motion.  Neurological: He is alert and oriented to person, place, and time.  Skin: Skin is warm and dry. No rash noted.  Psychiatric: He has a normal mood and affect.    ED Course  Procedures (including critical care time) Labs Review Labs Reviewed  CBG MONITORING, ED - Abnormal; Notable for the following:    Glucose-Capillary 593 (*)    All other components within normal limits  I-STAT CHEM 8, ED - Abnormal; Notable for the following:    Chloride 99 (*)  Creatinine, Ser 1.30 (*)    Glucose, Bld 531 (*)    All other components within normal limits  CBC  BASIC METABOLIC PANEL  URINALYSIS, ROUTINE W REFLEX MICROSCOPIC (NOT AT [facility])  CBG MONITORING, ED  CBG MONITORING, ED   Results for orders placed or performed during the hospital encounter of 05/13/16  Basic metabolic panel  Result Value Ref Range   Sodium 132 (L) 135 - 145 mmol/L    Potassium 4.4 3.5 - 5.1 mmol/L   Chloride 101 101 - 111 mmol/L   CO2 24 22 - 32 mmol/L   Glucose, Bld 539 (H) 65 - 99 mg/dL   BUN 16 6 - 20 mg/dL   Creatinine, Ser 6.961.33 (H) 0.61 - 1.24 mg/dL   Calcium 9.2 8.9 - 29.510.3 mg/dL   GFR calc non Af Amer >60 >60 mL/min   GFR calc Af Amer >60 >60 mL/min   Anion gap 7 5 - 15  CBC  Result Value Ref Range   WBC 4.7 4.0 - 10.5 K/uL   RBC 4.81 4.22 - 5.81 MIL/uL   Hemoglobin 14.4 13.0 - 17.0 g/dL   HCT 28.441.4 13.239.0 - 44.052.0 %   MCV 86.1 78.0 - 100.0 fL   MCH 29.9 26.0 - 34.0 pg   MCHC 34.8 30.0 - 36.0 g/dL   RDW 10.212.1 72.511.5 - 36.615.5 %   Platelets 162 150 - 400 K/uL  Urinalysis, Routine w reflex microscopic  Result Value Ref Range   Color, Urine YELLOW YELLOW   APPearance CLEAR CLEAR   Specific Gravity, Urine 1.028 1.005 - 1.030   pH 6.5 5.0 - 8.0   Glucose, UA >1000 (A) NEGATIVE mg/dL   Hgb urine dipstick NEGATIVE NEGATIVE   Bilirubin Urine NEGATIVE NEGATIVE   Ketones, ur NEGATIVE NEGATIVE mg/dL   Protein, ur NEGATIVE NEGATIVE mg/dL   Nitrite NEGATIVE NEGATIVE   Leukocytes, UA NEGATIVE NEGATIVE  Urine microscopic-add on  Result Value Ref Range   Squamous Epithelial / LPF 0-5 (A) NONE SEEN   WBC, UA NONE SEEN 0 - 5 WBC/hpf   RBC / HPF 0-5 0 - 5 RBC/hpf   Bacteria, UA NONE SEEN NONE SEEN  CBG monitoring, ED  Result Value Ref Range   Glucose-Capillary 593 (HH) 65 - 99 mg/dL   Comment 1 Notify RN    Comment 2 Document in Chart   CBG monitoring, ED  Result Value Ref Range   Glucose-Capillary 511 (H) 65 - 99 mg/dL  I-stat Chem 8, ED  Result Value Ref Range   Sodium 136 135 - 145 mmol/L   Potassium 4.3 3.5 - 5.1 mmol/L   Chloride 99 (L) 101 - 111 mmol/L   BUN 20 6 - 20 mg/dL   Creatinine, Ser 4.401.30 (H) 0.61 - 1.24 mg/dL   Glucose, Bld 347531 (H) 65 - 99 mg/dL   Calcium, Ion 4.251.18 9.561.12 - 1.23 mmol/L   TCO2 25 0 - 100 mmol/L   Hemoglobin 16.3 13.0 - 17.0 g/dL   HCT 38.748.0 56.439.0 - 33.252.0 %    Imaging Review No results found. I have personally  reviewed and evaluated these images and lab results as part of my medical decision-making.   EKG Interpretation None      MDM   Final diagnoses:  None    1. Hyperglycemia 2. Medication noncompliance  The patient is very well appearing. Drinking fluids, in NAD. VSS, no tachycardia. CBG 593 with no evidence of acidosis. Will hydrate with 1-2 liters and recheck CBG.  If trending down, will restart Metformin and discharge.   Fluids provided. Metformin started in ED. He is very comfortable appearing, not acidotic, VSS. Discussed with Dr. Anitra Lauth. He can be discharged home with Rx metformin and PCP follow up.      Elpidio Anis, PA-C 05/14/16 1416  Gwyneth Sprout, MD 05/15/16 [phone]

## 2016-05-13 NOTE — ED Notes (Signed)
Carb modified meal tray ordered for patient.

## 2016-05-13 NOTE — ED Notes (Signed)
Pt here with hyperglycemia, has type 2 DM, supposed to take metformin but has been off. Is staying [facility]. C/o polyuria, polydipsia, blurry vision. Also pain behind right knee x 2 months

## 2016-05-13 NOTE — Discharge Instructions (Signed)
YOU NEED TO CHECK YOUR BLOOD SUGAR 2-3 TIMES DAILY AND TAKE YOUR MEDICATION AS PRESCRIBED. FOLLOW UP WITH A PRIMARY CARE DOCTOR OF YOUR CHOICE (HERE OR IN [place]) FOR FURTHER CARE OF YOUR DIABETES. RETURN HERE AS NEEDED.  Hyperglycemia Hyperglycemia occurs when the glucose (sugar) in your blood is too high. Hyperglycemia can happen for many reasons, but it most often happens to people who do not know they have diabetes or are not managing their diabetes properly.  CAUSES  Whether you have diabetes or not, there are other causes of hyperglycemia. Hyperglycemia can occur when you have diabetes, but it can also occur in other situations that you might not be as aware of, such as: Diabetes  If you have diabetes and are having problems controlling your blood glucose, hyperglycemia could occur because of some of the following reasons:  Not following your meal plan.  Not taking your diabetes medications or not taking it properly.  Exercising less or doing less activity than you normally do.  Being sick. Pre-diabetes  This cannot be ignored. Before people develop Type 2 diabetes, they almost always have "pre-diabetes." This is when your blood glucose levels are higher than normal, but not yet high enough to be diagnosed as diabetes. Research has shown that some long-term damage to the body, especially the heart and circulatory system, may already be occurring during pre-diabetes. If you take action to manage your blood glucose when you have pre-diabetes, you may delay or prevent Type 2 diabetes from developing. Stress  If you have diabetes, you may be "diet" controlled or on oral medications or insulin to control your diabetes. However, you may find that your blood glucose is higher than usual in the hospital whether you have diabetes or not. This is often referred to as "stress hyperglycemia." Stress can elevate your blood glucose. This happens because of hormones put out by the body during times of  stress. If stress has been the cause of your high blood glucose, it can be followed regularly by your caregiver. That way he/she can make sure your hyperglycemia does not continue to get worse or progress to diabetes. Steroids  Steroids are medications that act on the infection fighting system (immune system) to block inflammation or infection. One side effect can be a rise in blood glucose. Most people can produce enough extra insulin to allow for this rise, but for those who cannot, steroids make blood glucose levels go even higher. It is not unusual for steroid treatments to "uncover" diabetes that is developing. It is not always possible to determine if the hyperglycemia will go away after the steroids are stopped. A special blood test called an A1c is sometimes done to determine if your blood glucose was elevated before the steroids were started. SYMPTOMS  Thirsty.  Frequent urination.  Dry mouth.  Blurred vision.  Tired or fatigue.  Weakness.  Sleepy.  Tingling in feet or leg. DIAGNOSIS  Diagnosis is made by monitoring blood glucose in one or all of the following ways:  A1c test. This is a chemical found in your blood.  Fingerstick blood glucose monitoring.  Laboratory results. TREATMENT  First, knowing the cause of the hyperglycemia is important before the hyperglycemia can be treated. Treatment may include, but is not be limited to:  Education.  Change or adjustment in medications.  Change or adjustment in meal plan.  Treatment for an illness, infection, etc.  More frequent blood glucose monitoring.  Change in exercise plan.  Decreasing or stopping  steroids.  Lifestyle changes. HOME CARE INSTRUCTIONS   Test your blood glucose as directed.  Exercise regularly. Your caregiver will give you instructions about exercise. Pre-diabetes or diabetes which comes on with stress is helped by exercising.  Eat wholesome, balanced meals. Eat often and at regular, fixed  times. Your caregiver or nutritionist will give you a meal plan to guide your sugar intake.  Being at an ideal weight is important. If needed, losing as little as 10 to 15 pounds may help improve blood glucose levels. SEEK MEDICAL CARE IF:   You have questions about medicine, activity, or diet.  You continue to have symptoms (problems such as increased thirst, urination, or weight gain). SEEK IMMEDIATE MEDICAL CARE IF:   You are vomiting or have diarrhea.  Your breath smells fruity.  You are breathing faster or slower.  You are very sleepy or incoherent.  You have numbness, tingling, or pain in your feet or hands.  You have chest pain.  Your symptoms get worse even though you have been following your caregiver's orders.  If you have any other questions or concerns.   This information is not intended to replace advice given to you by your health care provider. Make sure you discuss any questions you have with your health care provider.   Document Released: 06/01/2001 Document Revised: 02/28/2012 Document Reviewed: 08/12/2015 Elsevier Interactive Patient Education [employer]

## 2021-07-18 ENCOUNTER — Emergency Department (HOSPITAL_COMMUNITY): Payer: Self-pay

## 2021-07-18 ENCOUNTER — Inpatient Hospital Stay (HOSPITAL_COMMUNITY)
Admission: EM | Admit: 2021-07-18 | Discharge: 2021-07-21 | DRG: 637 | Disposition: A | Discharge status: Home / Self Care | Payer: Self-pay | Attending: Internal Medicine | Admitting: Internal Medicine

## 2021-07-18 DIAGNOSIS — N1831 Chronic kidney disease, stage 3a: Secondary | ICD-10-CM | POA: Diagnosis present

## 2021-07-18 DIAGNOSIS — E860 Dehydration: Secondary | ICD-10-CM | POA: Diagnosis present

## 2021-07-18 DIAGNOSIS — Z833 Family history of diabetes mellitus: Secondary | ICD-10-CM

## 2021-07-18 DIAGNOSIS — T383X6A Underdosing of insulin and oral hypoglycemic [antidiabetic] drugs, initial encounter: Secondary | ICD-10-CM | POA: Diagnosis present

## 2021-07-18 DIAGNOSIS — R739 Hyperglycemia, unspecified: Secondary | ICD-10-CM | POA: Diagnosis present

## 2021-07-18 DIAGNOSIS — E1110 Type 2 diabetes mellitus with ketoacidosis without coma: Principal | ICD-10-CM | POA: Diagnosis present

## 2021-07-18 DIAGNOSIS — Z7984 Long term (current) use of oral hypoglycemic drugs: Secondary | ICD-10-CM

## 2021-07-18 DIAGNOSIS — E1122 Type 2 diabetes mellitus with diabetic chronic kidney disease: Secondary | ICD-10-CM | POA: Diagnosis present

## 2021-07-18 DIAGNOSIS — Z87891 Personal history of nicotine dependence: Secondary | ICD-10-CM

## 2021-07-18 DIAGNOSIS — Z91138 Patient's unintentional underdosing of medication regimen for other reason: Secondary | ICD-10-CM

## 2021-07-18 DIAGNOSIS — Z20822 Contact with and (suspected) exposure to covid-19: Secondary | ICD-10-CM | POA: Diagnosis present

## 2021-07-18 DIAGNOSIS — G9341 Metabolic encephalopathy: Secondary | ICD-10-CM | POA: Diagnosis present

## 2021-07-18 LAB — CBC WITH DIFFERENTIAL/PLATELET
Abs Immature Granulocytes: 0.03 10*3/uL (ref 0.00–0.07)
Basophils Absolute: 0 10*3/uL (ref 0.0–0.1)
Basophils Relative: 1 %
Eosinophils Absolute: 0.1 10*3/uL (ref 0.0–0.5)
Eosinophils Relative: 3 %
HCT: 37 % — ABNORMAL LOW (ref 39.0–52.0)
Hemoglobin: 12.3 g/dL — ABNORMAL LOW (ref 13.0–17.0)
Immature Granulocytes: 1 %
Lymphocytes Relative: 46 %
Lymphs Abs: 1.9 10*3/uL (ref 0.7–4.0)
MCH: 30.6 pg (ref 26.0–34.0)
MCHC: 33.2 g/dL (ref 30.0–36.0)
MCV: 92 fL (ref 80.0–100.0)
Monocytes Absolute: 0.4 10*3/uL (ref 0.1–1.0)
Monocytes Relative: 10 %
Neutro Abs: 1.6 10*3/uL — ABNORMAL LOW (ref 1.7–7.7)
Neutrophils Relative %: 39 %
Platelets: 184 10*3/uL (ref 150–400)
RBC: 4.02 MIL/uL — ABNORMAL LOW (ref 4.22–5.81)
RDW: 12.8 % (ref 11.5–15.5)
WBC: 4.1 10*3/uL (ref 4.0–10.5)
nRBC: 0 % (ref 0.0–0.2)

## 2021-07-18 LAB — I-STAT VENOUS BLOOD GAS, ED
Acid-base deficit: 3 mmol/L — ABNORMAL HIGH (ref 0.0–2.0)
Bicarbonate: 20.4 mmol/L (ref 20.0–28.0)
Calcium, Ion: 1.04 mmol/L — ABNORMAL LOW (ref 1.15–1.40)
HCT: 38 % — ABNORMAL LOW (ref 39.0–52.0)
Hemoglobin: 12.9 g/dL — ABNORMAL LOW (ref 13.0–17.0)
O2 Saturation: 91 %
Potassium: 4.6 mmol/L (ref 3.5–5.1)
Sodium: 130 mmol/L — ABNORMAL LOW (ref 135–145)
TCO2: 21 mmol/L — ABNORMAL LOW (ref 22–32)
pCO2, Ven: 29.7 mmHg — ABNORMAL LOW (ref 44.0–60.0)
pH, Ven: 7.446 — ABNORMAL HIGH (ref 7.250–7.430)
pO2, Ven: 58 mmHg — ABNORMAL HIGH (ref 32.0–45.0)

## 2021-07-18 LAB — COMPREHENSIVE METABOLIC PANEL
ALT: 29 U/L (ref 0–44)
AST: 25 U/L (ref 15–41)
Albumin: 3.5 g/dL (ref 3.5–5.0)
Alkaline Phosphatase: 124 U/L (ref 38–126)
Anion gap: 10 (ref 5–15)
BUN: 20 mg/dL (ref 6–20)
CO2: 21 mmol/L — ABNORMAL LOW (ref 22–32)
Calcium: 8.8 mg/dL — ABNORMAL LOW (ref 8.9–10.3)
Chloride: 98 mmol/L (ref 98–111)
Creatinine, Ser: 1.47 mg/dL — ABNORMAL HIGH (ref 0.61–1.24)
GFR, Estimated: 56 mL/min — ABNORMAL LOW (ref >60)
Glucose, Bld: 751 mg/dL (ref 70–99)
Potassium: 4.7 mmol/L (ref 3.5–5.1)
Sodium: 129 mmol/L — ABNORMAL LOW (ref 135–145)
Total Bilirubin: 0.8 mg/dL (ref 0.3–1.2)
Total Protein: 6.8 g/dL (ref 6.5–8.1)

## 2021-07-18 LAB — URINALYSIS, ROUTINE W REFLEX MICROSCOPIC
Bacteria, UA: NONE SEEN
Bilirubin Urine: NEGATIVE
Glucose, UA: >=500 mg/dL — AB
Hgb urine dipstick: NEGATIVE
Ketones, ur: NEGATIVE mg/dL
Leukocytes,Ua: NEGATIVE
Nitrite: NEGATIVE
Protein, ur: NEGATIVE mg/dL
Specific Gravity, Urine: 1.02 (ref 1.005–1.030)
pH: 6 (ref 5.0–8.0)

## 2021-07-18 LAB — CBC
HCT: 36.2 % — ABNORMAL LOW (ref 39.0–52.0)
Hemoglobin: 12.3 g/dL — ABNORMAL LOW (ref 13.0–17.0)
MCH: 30.8 pg (ref 26.0–34.0)
MCHC: 34 g/dL (ref 30.0–36.0)
MCV: 90.7 fL (ref 80.0–100.0)
Platelets: 170 10*3/uL (ref 150–400)
RBC: 3.99 MIL/uL — ABNORMAL LOW (ref 4.22–5.81)
RDW: 12.7 % (ref 11.5–15.5)
WBC: 3.8 10*3/uL — ABNORMAL LOW (ref 4.0–10.5)
nRBC: 0 % (ref 0.0–0.2)

## 2021-07-18 LAB — CBG MONITORING, ED
Glucose-Capillary: 447 mg/dL — ABNORMAL HIGH (ref 70–99)
Glucose-Capillary: 557 mg/dL (ref 70–99)
Glucose-Capillary: 580 mg/dL (ref 70–99)
Glucose-Capillary: >600 mg/dL (ref 70–99)

## 2021-07-18 MED ORDER — INSULIN REGULAR(HUMAN) IN NACL 100-0.9 UT/100ML-% IV SOLN
INTRAVENOUS | Status: DC
  Administered 2021-07-18: 15 [IU]/h via INTRAVENOUS
  Filled 2021-07-18: qty 100

## 2021-07-18 MED ORDER — MELATONIN 3 MG PO TABS: 3.0000 mg | ORAL_TABLET | Freq: Every evening | ORAL | Status: DC | PRN

## 2021-07-18 MED ORDER — DEXTROSE 50 % IV SOLN: 0.0000 mL | INTRAVENOUS | Status: DC | PRN

## 2021-07-18 MED ORDER — ACETAMINOPHEN 325 MG PO TABS: 650.0000 mg | ORAL_TABLET | Freq: Once | ORAL | Status: DC

## 2021-07-18 MED ORDER — DEXTROSE IN LACTATED RINGERS 5 % IV SOLN: INTRAVENOUS | Status: DC

## 2021-07-18 MED ORDER — ACETAMINOPHEN 325 MG PO TABS: 650.0000 mg | ORAL_TABLET | Freq: Four times a day (QID) | ORAL | Status: DC | PRN

## 2021-07-18 MED ORDER — INSULIN GLARGINE-YFGN 100 UNIT/ML ~~LOC~~ SOLN
10.0000 [IU] | Freq: Two times a day (BID) | SUBCUTANEOUS | Status: DC
  Administered 2021-07-19: 10 [IU] via SUBCUTANEOUS
  Filled 2021-07-18 (×2): qty 0.1

## 2021-07-18 MED ORDER — LACTATED RINGERS IV SOLN: INTRAVENOUS | Status: DC

## 2021-07-18 MED ORDER — ONDANSETRON HCL 4 MG/2ML IJ SOLN: 4.0000 mg | Freq: Four times a day (QID) | INTRAMUSCULAR | Status: DC | PRN

## 2021-07-18 MED ORDER — POLYETHYLENE GLYCOL 3350 17 G PO PACK: 17.0000 g | PACK | Freq: Every day | ORAL | Status: DC | PRN

## 2021-07-18 MED ORDER — ENOXAPARIN SODIUM 40 MG/0.4ML IJ SOSY
40.0000 mg | PREFILLED_SYRINGE | Freq: Every day | INTRAMUSCULAR | Status: DC
  Administered 2021-07-19 – 2021-07-21 (×3): 40 mg via SUBCUTANEOUS
  Filled 2021-07-18 (×3): qty 0.4

## 2021-07-18 NOTE — ED Triage Notes (Signed)
Pt is here from "out of town" , gives very poor medical history at time of triage. Reports being non-insulin dependent diabetic and medication compliance

## 2021-07-18 NOTE — ED Provider Notes (Signed)
[facility] EMERGENCY DEPARTMENT Provider Note   CSN: [id number] Arrival date & time: 07/18/21  1940     History Chief Complaint  Patient presents with   Hyperglycemia    Malik Carter is a 55 y.o. male.  Patient states he recently moved back here.  Has not been feeling well for couple days admits to being out of his metformin medication at least for a day.  Patient has type 2 diabetes.  Patient states he has had some nausea but no vomiting.  No abdominal pain.  A little bit of shortness of breath.  No chest pain.  Temp 100.2 no congestion.  COVID testing will be done.  Heart rate is 84 blood pressure 147/91 room air oxygenation is 99%.  Patient's blood sugar on presentation was greater than 600.      Past Medical History:  Diagnosis Date   Diabetes mellitus without complication (HCC)    not taking insulin for a while    Patient Active Problem List   Diagnosis Date Noted   Phimosis/adherent prepuce 12/30/2011   Hypokalemia 12/25/2011   Type 2 diabetes mellitus with hyperosmolar nonketotic hyperglycemia (HCC) 12/24/2011   Dehydration 12/24/2011   ARF (acute renal failure) (HCC) 12/24/2011    No past surgical history on file.     Family History  Problem Relation Age of Onset   Diabetes Father     Social History   Tobacco Use   Smoking status: Former   Smokeless tobacco: Never  Substance Use Topics   Alcohol use: No   Drug use: Yes    Types: Marijuana, Cocaine    Home Medications Prior to Admission medications   Medication Sig Start Date End Date Taking? Authorizing Provider  amoxicillin (AMOXIL) 500 MG capsule Take 1 capsule (500 mg total) by mouth 3 (three) times daily. Patient not taking: Reported on 05/13/2016 02/11/14   Lurene Shadow, PA-C  metFORMIN (GLUCOPHAGE) 500 MG tablet Take 1 tablet (500 mg total) by mouth 2 (two) times daily with a meal. 05/13/16   Upstill, Melvenia Beam, PA-C  traMADol (ULTRAM) 50 MG tablet Take 1 tablet (50 mg total) by  mouth every 6 (six) hours as needed. Patient not taking: Reported on 05/13/2016 02/11/14   Lurene Shadow, PA-C    Allergies    Patient has no known allergies.  Review of Systems   Review of Systems  Constitutional:  Negative for chills and fever.  HENT:  Negative for congestion, ear pain and sore throat.   Eyes:  Negative for pain and visual disturbance.  Respiratory:  Positive for shortness of breath. Negative for cough.   Cardiovascular:  Negative for chest pain, palpitations and leg swelling.  Gastrointestinal:  Positive for nausea. Negative for abdominal pain, diarrhea and vomiting.  Genitourinary:  Negative for dysuria and hematuria.  Musculoskeletal:  Negative for arthralgias and back pain.  Skin:  Negative for color change and rash.  Neurological:  Negative for seizures and syncope.  All other systems reviewed and are negative.  Physical Exam Updated Vital Signs BP (!) 153/109 (BP Location: Right Arm)   Pulse 66   Temp 100.2 F (37.9 C) (Oral)   Resp 16   SpO2 99%   Physical Exam Vitals and nursing note reviewed.  Constitutional:      General: He is not in acute distress.    Appearance: Normal appearance. He is well-developed. He is not ill-appearing.  HENT:     Head: Normocephalic and atraumatic.  Mouth/Throat:     Mouth: Mucous membranes are dry.  Eyes:     Conjunctiva/sclera: Conjunctivae normal.     Pupils: Pupils are equal, round, and reactive to light.  Cardiovascular:     Rate and Rhythm: Normal rate and regular rhythm.     Heart sounds: No murmur heard. Pulmonary:     Effort: Pulmonary effort is normal. No respiratory distress.     Breath sounds: Normal breath sounds. No wheezing or rales.  Abdominal:     General: There is no distension.     Palpations: Abdomen is soft.     Tenderness: There is no abdominal tenderness.  Musculoskeletal:        General: Swelling present.     Cervical back: Normal range of motion and neck supple.     Comments:  Some trace lower extremity edema  Skin:    General: Skin is warm and dry.     Capillary Refill: Capillary refill takes less than 2 seconds.  Neurological:     General: No focal deficit present.     Mental Status: He is alert and oriented to person, place, and time.     Cranial Nerves: No cranial nerve deficit.     Sensory: No sensory deficit.     Motor: No weakness.    ED Results / Procedures / Treatments   Labs (all labs ordered are listed, but only abnormal results are displayed) Labs Reviewed  COMPREHENSIVE METABOLIC PANEL - Abnormal; Notable for the following components:      Result Value   Sodium 129 (*)    CO2 21 (*)    Glucose, Bld 751 (*)    Creatinine, Ser 1.47 (*)    Calcium 8.8 (*)    GFR, Estimated 56 (*)    All other components within normal limits  CBC WITH DIFFERENTIAL/PLATELET - Abnormal; Notable for the following components:   RBC 4.02 (*)    Hemoglobin 12.3 (*)    HCT 37.0 (*)    Neutro Abs 1.6 (*)    All other components within normal limits  URINALYSIS, ROUTINE W REFLEX MICROSCOPIC - Abnormal; Notable for the following components:   Color, Urine COLORLESS (*)    Glucose, UA >=500 (*)    All other components within normal limits  CBG MONITORING, ED - Abnormal; Notable for the following components:   Glucose-Capillary >600 (*)    All other components within normal limits  I-STAT VENOUS BLOOD GAS, ED - Abnormal; Notable for the following components:   pH, Ven 7.446 (*)    pCO2, Ven 29.7 (*)    pO2, Ven 58.0 (*)    TCO2 21 (*)    Acid-base deficit 3.0 (*)    Sodium 130 (*)    Calcium, Ion 1.04 (*)    HCT 38.0 (*)    Hemoglobin 12.9 (*)    All other components within normal limits  CBG MONITORING, ED - Abnormal; Notable for the following components:   Glucose-Capillary 580 (*)    All other components within normal limits  RESP PANEL BY RT-PCR (FLU A&B, COVID) ARPGX2    EKG None  Radiology No results found.  Procedures Procedures    Medications Ordered in ED Medications  acetaminophen (TYLENOL) tablet 650 mg (has no administration in time range)  insulin regular, human (MYXREDLIN) 100 units/ 100 mL infusion (has no administration in time range)  lactated ringers infusion (has no administration in time range)  dextrose 5 % in lactated ringers infusion (has no administration  in time range)  dextrose 50 % solution 0-50 mL (has no administration in time range)    ED Course  I have reviewed the triage vital signs and the nursing notes.  Pertinent labs & imaging results that were available during my care of the patient were reviewed by me and considered in my medical decision making (see chart for details).    MDM Rules/Calculators/A&P                         CRITICAL CARE Performed by: Vanetta Mulders Total critical care time: 45 minutes Critical care time was exclusive of separately billable procedures and treating other patients. Critical care was necessary to treat or prevent imminent or life-threatening deterioration. Critical care was time spent personally by me on the following activities: development of treatment plan with patient and/or surrogate as well as nursing, discussions with consultants, evaluation of patient's response to treatment, examination of patient, obtaining history from patient or surrogate, ordering and performing treatments and interventions, ordering and review of laboratory studies, ordering and review of radiographic studies, pulse oximetry and re-evaluation of patient's condition.   Patient nontoxic no acute distress.  Suspect patient's been out of his oral hypoglycemic medication for longer than a day.  Initial blood sugar fingerstick was greater than 600.  Patient's initial blood work the blood sugar was 751.  CO2 was 21.  Creatinine 1.47.  For a GFR of 56.  Blood cell count is 4.1.  And was borderline temp here at 100.2.  Oxygen saturation is good at 99%.  Chest x-ray without any  acute findings but awaiting official radiology reading.  Urinalysis negative for urinary tract infection.  Patient most recent fingerstick was 580.  We will started on hypoglycemic protocol.  Patient had a blood gas done, pH was 7.44.  PCO2 was 29.  And PO2 58.  Venous blood gas.  So no distinct acidosis.  Type II diabetic.  Will be started on hyperglycemic protocol.  Patient aware that he will require admission to get his blood sugars under control.  We discussed with internal medicine for unassigned admission.   Final Clinical Impression(s) / ED Diagnoses Final diagnoses:  Hyperglycemia    Rx / DC Orders ED Discharge Orders     None        Vanetta Mulders, MD 07/18/21 2234

## 2021-07-18 NOTE — ED Provider Notes (Signed)
Emergency Medicine Provider Triage Evaluation Note  Eladio Dentremont , a 55 y.o. male  was evaluated in triage.  Pt complains of elevated blood sugar.  Patient states he is in the process of moving here from [place] and for the last 2 days has had high blood sugar He is mostly complaint with his medications he says. He admits to increase thirst and urination. He also reports intermittent facial twitching for several months. Has not used drugs in x 6 months.  Review of Systems  Positive: Polyuria, polydipsia Negative: Headache, visual changes  Physical Exam  BP (!) 147/91   Pulse 84   Temp 100.2 F (37.9 C) (Oral)   SpO2 99%  Gen:   Awake, no distress   Resp:  Normal effort  MSK:   Moves extremities without difficulty  Other:  Alert and oriented x 4. No facial droop  Medical Decision Making  Medically screening exam initiated at 8:16 PM.  Appropriate orders placed.  Zenda Alpers was informed that the remainder of the evaluation will be completed by another provider, this initial triage assessment does not replace that evaluation, and the importance of remaining in the ED until their evaluation is complete.  Low grade temp in triage. POC glucose > 600. Labs ordered including VBG to look for possible DKA.   Portions of this note were generated with Scientist, clinical (histocompatibility and immunogenetics). Dictation errors may occur despite best attempts at proofreading.    Shanon Ace, PA-C 07/18/21 2020    Vanetta Mulders, MD 07/18/21 2213

## 2021-07-18 NOTE — H&P (Signed)
History and Physical  Malik Carter ONG:[id number] DOB: Jun 20, 1966 DOA: 07/18/2021  Referring physician: Dr. Deretha Emory, EDP  PCP: Patient, No Pcp Per (Inactive)  Outpatient Specialists: None Patient coming from: Home  Chief Complaint: Nausea, elevated blood sugar, shortness of breath.  HPI: Malik Carter is a 55 y.o. male with medical history significant for Obesity, DM2, who recently relocated back to [place] area from [place] presents with nausea and no vomiting, hyperglycemia associated with increased thirst and urination for the past 2 days.  No abdominal pain.  He ran out of his metformin 2 days ago.  Endorses some shortness of breath.  No chest pain.  He presented to the ED for further evaluation.  On presentation, serum glucose greater than 700 with mild compensated metabolic acidosis.  Temperature with T-max 100.2.  COVID-19 screening test pending at the time of this dictation.  Started on Endo tool in the ED.  EDP requested admission for hyperglycemia.  History is mainly obtained from EDP, review of medical records as the patient is lethargic at the time of this visit.  He arouses to voices, follows commands, and drifts back to sleep.  ED Course:  T-max 100.2.  BP 123/87, pulse 73, respiratory 21, O2 saturation 94% on room air.  Lab studies remarkable for serum sodium 129, serum glucose 751, serum bicarb 21, BUN 20, creatinine 1.47, GFR 56.  CBC essentially unremarkable.  Review of Systems: Review of systems as noted in the HPI. All other systems reviewed and are negative.   Past Medical History:  Diagnosis Date   Diabetes mellitus without complication (HCC)    not taking insulin for a while   No past surgical history on file.  Social History:  reports that he has quit smoking. He has never used smokeless tobacco. He reports current drug use. Drugs: Marijuana and Cocaine. He reports that he does not drink alcohol.   No Known Allergies  Family History  Problem Relation  Age of Onset   Diabetes Father       Prior to Admission medications   Medication Sig Start Date End Date Taking? Authorizing Provider  amoxicillin (AMOXIL) 500 MG capsule Take 1 capsule (500 mg total) by mouth 3 (three) times daily. Patient not taking: Reported on 05/13/2016 02/11/14   Lurene Shadow, PA-C  metFORMIN (GLUCOPHAGE) 500 MG tablet Take 1 tablet (500 mg total) by mouth 2 (two) times daily with a meal. 05/13/16   Upstill, Melvenia Beam, PA-C  traMADol (ULTRAM) 50 MG tablet Take 1 tablet (50 mg total) by mouth every 6 (six) hours as needed. Patient not taking: Reported on 05/13/2016 02/11/14   Rolla Plate    Physical Exam: BP 123/87   Pulse 73   Temp 100.2 F (37.9 C) (Oral)   Resp (!) 21   SpO2 94%   General: 55 y.o. year-old male well developed well nourished in no acute distress.  Somnolent, arouses to voice, follows commands, then drifts back to sleep. Cardiovascular: Regular rate and rhythm with no rubs or gallops.  No thyromegaly or JVD noted.  1+ pitting edema in lower extremities bilaterally.  Respiratory: Clear to auscultation with no wheezes or rales. Good inspiratory effort. Abdomen: Soft nontender nondistended with normal bowel sounds x4 quadrants. Muskuloskeletal: No cyanosis or clubbing.  1+ pitting edema in lower extremities bilaterally. Neuro: CN II-XII intact, strength, sensation, reflexes Skin: No ulcerative lesions noted or rashes Psychiatry: Judgement and insight appear altered due to somnolence.  Unable to assess mood due to somnolence.  Labs on Admission:  Basic Metabolic Panel: Recent Labs  Lab 07/18/21 2016 07/18/21 2022  NA 129* 130*  K 4.7 4.6  CL 98  --   CO2 21*  --   GLUCOSE 751*  --   BUN 20  --   CREATININE 1.47*  --   CALCIUM 8.8*  --    Liver Function Tests: Recent Labs  Lab 07/18/21 2016  AST 25  ALT 29  ALKPHOS 124  BILITOT 0.8  PROT 6.8  ALBUMIN 3.5   No results for input(s): LIPASE, AMYLASE in the last 168  hours. No results for input(s): AMMONIA in the last 168 hours. CBC: Recent Labs  Lab 07/18/21 2016 07/18/21 2022  WBC 4.1  --   NEUTROABS 1.6*  --   HGB 12.3* 12.9*  HCT 37.0* 38.0*  MCV 92.0  --   PLT 184  --    Cardiac Enzymes: No results for input(s): CKTOTAL, CKMB, CKMBINDEX, TROPONINI in the last 168 hours.  BNP (last 3 results) No results for input(s): BNP in the last 8760 hours.  ProBNP (last 3 results) No results for input(s): PROBNP in the last 8760 hours.  CBG: Recent Labs  Lab 07/18/21 1947 07/18/21 2202 07/18/21 2302  GLUCAP >600* 580* 557*    Radiological Exams on Admission: DG Chest Port 1 View  Result Date: 07/18/2021 CLINICAL DATA:  Hyperglycemia. EXAM: PORTABLE CHEST 1 VIEW COMPARISON:  None. FINDINGS: Upper normal heart size. Normal mediastinal contours allowing for rotation. Mild bibasilar atelectasis without confluent airspace disease. No pulmonary edema, pleural effusion, or pneumothorax. No acute osseous abnormalities are seen. IMPRESSION: Mild bibasilar atelectasis. Electronically Signed   By: Narda Rutherford M.D.   On: 07/18/2021 22:49    EKG: I independently viewed the EKG done and my findings are as followed: No EKG available at time of this visit.  Assessment/Plan Present on Admission:  Hyperglycemia  Active Problems:   Hyperglycemia  Hyperglycemia/HHS secondary to noncompliance Reportedly ran out of his home metformin x2 days He is mentally altered, lethargic, at the time of my visit. Last hemoglobin A1c 11.1 on 12/25/2011.  Recently moved back to the [place] area from [place] and for the past 2 days has had elevated blood sugar. Repeat hemoglobin A1c. Start Lantus 10 unit twice daily. He was started on Endo tool in the ED, continue for now then transition to subcu insulin once serum glucose is less than 250.   Once transitioned to subcu insulin, start carb modified diet when more alert. Diabetes coordinator consulted for  patient's education.  Acute toxic metabolic acidosis in the setting of hyperglycemia, suspect HHS Continue Endo tool per protocol Reorient as needed Avoid sedative agents  Mild metabolic acidosis in the setting of hyperglycemia and likely dehydration. Serum bicarb 21, anion gap 10 Venous blood gas pH 7.446 with PCO2 of 29.7. Continue aggressive IV fluid hydration and insulin drip.  Pseudohyponatremia Serum sodium (129) correction for serum hyperglycemia(751) calculated 139. Continue IV fluid hydration LR at 125 cc/h  CKD 3A He appears to be close to his baseline creatinine 1.47 with GFR 56. Avoid nephrotoxic agent, dehydration and hypotension Monitor urine output Repeat BMP in the morning.   DVT prophylaxis: Subcu Lovenox daily  Code Status: Full code  Family Communication: None at bedside  Disposition Plan: Admitted to progressive unit  Consults called: Diabetes coordinator  Admission status: Observation status.   Status is: Observation   Dispo:  Patient From: Home  Planned Disposition: Home, possibly on 07/19/2021 when  CBGs are under control and he is tolerating a diet.  Medically stable for discharge: No      Darlin Drop MD [facility] Pager [phone]  If 7PM-7AM, please contact night-coverage [email or web address] Password [email or web address]  07/18/2021, 11:05 PM

## 2021-07-19 ENCOUNTER — Other Ambulatory Visit: Payer: Self-pay

## 2021-07-19 ENCOUNTER — Observation Stay (HOSPITAL_COMMUNITY): Payer: Self-pay

## 2021-07-19 DIAGNOSIS — E1110 Type 2 diabetes mellitus with ketoacidosis without coma: Secondary | ICD-10-CM | POA: Diagnosis present

## 2021-07-19 LAB — RAPID URINE DRUG SCREEN, HOSP PERFORMED
Amphetamines: NOT DETECTED
Barbiturates: NOT DETECTED
Benzodiazepines: NOT DETECTED
Cocaine: NOT DETECTED
Opiates: NOT DETECTED
Tetrahydrocannabinol: NOT DETECTED

## 2021-07-19 LAB — GLUCOSE, CAPILLARY
Glucose-Capillary: 170 mg/dL — ABNORMAL HIGH (ref 70–99)
Glucose-Capillary: 386 mg/dL — ABNORMAL HIGH (ref 70–99)

## 2021-07-19 LAB — HEMOGLOBIN A1C
Hgb A1c MFr Bld: 12.2 % — ABNORMAL HIGH (ref 4.8–5.6)
Mean Plasma Glucose: 303.44 mg/dL

## 2021-07-19 LAB — BASIC METABOLIC PANEL
Anion gap: 8 (ref 5–15)
BUN: 17 mg/dL (ref 6–20)
CO2: 26 mmol/L (ref 22–32)
Calcium: 9 mg/dL (ref 8.9–10.3)
Chloride: 106 mmol/L (ref 98–111)
Creatinine, Ser: 1.11 mg/dL (ref 0.61–1.24)
GFR, Estimated: >60 mL/min (ref >60)
Glucose, Bld: 144 mg/dL — ABNORMAL HIGH (ref 70–99)
Potassium: 3.5 mmol/L (ref 3.5–5.1)
Sodium: 140 mmol/L (ref 135–145)

## 2021-07-19 LAB — HIV ANTIBODY (ROUTINE TESTING W REFLEX): HIV Screen 4th Generation wRfx: NONREACTIVE

## 2021-07-19 LAB — CBC
HCT: 37.1 % — ABNORMAL LOW (ref 39.0–52.0)
Hemoglobin: 12 g/dL — ABNORMAL LOW (ref 13.0–17.0)
MCH: 30 pg (ref 26.0–34.0)
MCHC: 32.3 g/dL (ref 30.0–36.0)
MCV: 92.8 fL (ref 80.0–100.0)
Platelets: UNDETERMINED 10*3/uL (ref 150–400)
RBC: 4 MIL/uL — ABNORMAL LOW (ref 4.22–5.81)
RDW: 12.8 % (ref 11.5–15.5)
WBC: 4 10*3/uL (ref 4.0–10.5)
nRBC: 0 % (ref 0.0–0.2)

## 2021-07-19 LAB — CREATININE, SERUM
Creatinine, Ser: 1.26 mg/dL — ABNORMAL HIGH (ref 0.61–1.24)
GFR, Estimated: >60 mL/min (ref >60)

## 2021-07-19 LAB — RESP PANEL BY RT-PCR (FLU A&B, COVID) ARPGX2
Influenza A by PCR: NEGATIVE
Influenza B by PCR: NEGATIVE
SARS Coronavirus 2 by RT PCR: NEGATIVE

## 2021-07-19 LAB — CBG MONITORING, ED
Glucose-Capillary: 171 mg/dL — ABNORMAL HIGH (ref 70–99)
Glucose-Capillary: 155 mg/dL — ABNORMAL HIGH (ref 70–99)
Glucose-Capillary: 146 mg/dL — ABNORMAL HIGH (ref 70–99)
Glucose-Capillary: 200 mg/dL — ABNORMAL HIGH (ref 70–99)
Glucose-Capillary: 359 mg/dL — ABNORMAL HIGH (ref 70–99)
Glucose-Capillary: 200 mg/dL — ABNORMAL HIGH (ref 70–99)

## 2021-07-19 LAB — TSH: TSH: 0.812 u[IU]/mL (ref 0.350–4.500)

## 2021-07-19 LAB — VITAMIN B12: Vitamin B-12: 413 pg/mL (ref 180–914)

## 2021-07-19 LAB — MAGNESIUM: Magnesium: 1.9 mg/dL (ref 1.7–2.4)

## 2021-07-19 MED ORDER — INSULIN ASPART 100 UNIT/ML IJ SOLN
0.0000 [IU] | Freq: Three times a day (TID) | INTRAMUSCULAR | Status: DC
  Administered 2021-07-19: 3 [IU] via SUBCUTANEOUS
  Administered 2021-07-19: 5 [IU] via SUBCUTANEOUS
  Administered 2021-07-20: 8 [IU] via SUBCUTANEOUS
  Administered 2021-07-20 (×2): 5 [IU] via SUBCUTANEOUS
  Administered 2021-07-21: 2 [IU] via SUBCUTANEOUS

## 2021-07-19 MED ORDER — INSULIN ASPART 100 UNIT/ML IJ SOLN
3.0000 [IU] | Freq: Three times a day (TID) | INTRAMUSCULAR | Status: DC
  Administered 2021-07-19 – 2021-07-20 (×4): 3 [IU] via SUBCUTANEOUS

## 2021-07-19 MED ORDER — INSULIN GLARGINE-YFGN 100 UNIT/ML ~~LOC~~ SOLN
20.0000 [IU] | Freq: Two times a day (BID) | SUBCUTANEOUS | Status: DC
  Administered 2021-07-19 (×2): 20 [IU] via SUBCUTANEOUS
  Filled 2021-07-19 (×5): qty 0.2

## 2021-07-19 MED ORDER — LIVING WELL WITH DIABETES BOOK
Freq: Once | Status: DC
  Filled 2021-07-19: qty 1

## 2021-07-19 MED ORDER — INSULIN ASPART 100 UNIT/ML IJ SOLN: 0.0000 [IU] | Freq: Every day | INTRAMUSCULAR | Status: DC

## 2021-07-19 MED ORDER — INSULIN ASPART 100 UNIT/ML IJ SOLN
0.0000 [IU] | Freq: Every day | INTRAMUSCULAR | Status: DC
  Administered 2021-07-19 – 2021-07-20 (×2): 5 [IU] via SUBCUTANEOUS

## 2021-07-19 MED ORDER — POTASSIUM CHLORIDE CRYS ER 20 MEQ PO TBCR
40.0000 meq | EXTENDED_RELEASE_TABLET | Freq: Once | ORAL | Status: AC
  Administered 2021-07-19: 40 meq via ORAL
  Filled 2021-07-19: qty 2

## 2021-07-19 MED ORDER — INSULIN STARTER KIT- PEN NEEDLES (ENGLISH)
1.0000 | Freq: Once | Status: DC
  Filled 2021-07-19: qty 1

## 2021-07-19 MED ORDER — INSULIN GLARGINE-YFGN 100 UNIT/ML ~~LOC~~ SOLN
5.0000 [IU] | Freq: Two times a day (BID) | SUBCUTANEOUS | Status: DC
  Filled 2021-07-19 (×2): qty 0.05

## 2021-07-19 MED ORDER — INSULIN ASPART 100 UNIT/ML IJ SOLN
0.0000 [IU] | Freq: Three times a day (TID) | INTRAMUSCULAR | Status: DC
  Administered 2021-07-19: 9 [IU] via SUBCUTANEOUS

## 2021-07-19 MED ORDER — DEXTROSE IN LACTATED RINGERS 5 % IV SOLN: INTRAVENOUS | Status: DC

## 2021-07-19 MED ORDER — LACTATED RINGERS IV SOLN: INTRAVENOUS | Status: AC

## 2021-07-19 NOTE — Progress Notes (Addendum)
Inpatient Diabetes Program Recommendations  AACE/ADA: New Consensus Statement on Inpatient Glycemic Control (2015)  Target Ranges:  Prepandial:   less than 140 mg/dL      Peak postprandial:   less than 180 mg/dL (1-2 hours)      Critically ill patients:  140 - 180 mg/dL   Lab Results  Component Value Date   GLUCAP 200 (H) 07/19/2021   HGBA1C 12.2 (H) 07/19/2021   Review of Glycemic Control Patient has been taking insulin @ home: Basal insulin 20 units qd Short Acting 5 units tid Patient is unsure of insulin names  Inpatient Diabetes Program Recommendations:   Spoke with patient via phone conference with [name] contact from [facility]. Patient has recently had issues with short term memory including remembering to take his insulin, Patient also just moved here so limited funds for health care needs. May benefit from Novolin Relion 70/30 insulin bid to decrease cost of insulin needed. [name] from [facility] checks on pt. And makes sure he keeps in contact and followed for needs.  Reviewed A1c 12.2 and explained what an A1C is, basic pathophysiology of DM Type 2, basic home care, basic diabetes diet nutrition principles, importance of checking CBGs and maintaining good CBG control to prevent long-term and short-term complications. Reviewed signs and symptoms of hyperglycemia and hypoglycemia and how to treat hypoglycemia at home. Also reviewed blood sugar goals at home.   Thank you, Malik Carter. Jermika Olden, RN, MSN, CDE  Diabetes Coordinator Inpatient Glycemic Control Team Team Pager [phone] (8am-5pm) 07/19/2021 2:44 PM

## 2021-07-19 NOTE — Plan of Care (Signed)

## 2021-07-19 NOTE — Significant Event (Addendum)
Patient is tearful stated that he feels alone living in a new area from [place]. He moved to the [place] area after completing a drug and alcohol Rehab program. Stated his belongings left at the [facility] house unsecured with Men that he do not trust. Pt needs Medication and transport assistance as well. Last alcohol beverage was 4 months ago.  And smokes 3-4 single cigarettes a day.

## 2021-07-19 NOTE — ED Notes (Signed)
Attempted report unable to receive at this time.

## 2021-07-19 NOTE — ED Notes (Signed)
MD at bedside. Pt confused. A/O X2 Self and place

## 2021-07-19 NOTE — ED Notes (Signed)
Report given to Tennis Must, RN & Feliz Beam, RN

## 2021-07-19 NOTE — ED Notes (Signed)
Patient transported to MRI 

## 2021-07-19 NOTE — ED Notes (Signed)
Attempted to give report. Unable to receive at this time

## 2021-07-19 NOTE — ED Notes (Signed)
Blood draw unsuccessful 

## 2021-07-19 NOTE — Progress Notes (Signed)
PROGRESS NOTE                                                                                                                                                                                                             Patient Demographics:    Malik Carter, is a 55 y.o. male, DOB - [birth year], WPY:[id number]  Outpatient Primary MD for the patient is Patient, No Pcp Per (Inactive)    LOS - 0  Admit date - 07/18/2021    Chief Complaint  Patient presents with   Hyperglycemia       Brief Narrative (HPI from H&P)  Malik Carter is a 55 y.o. male with medical history significant for DM2, who recently relocated back to [place] area from [place] presents with nausea and no vomiting, hyperglycemia associated with increased thirst and urination for the past 2 days   Subjective:    Malik Carter today has, No headache, No chest pain, No abdominal pain - No Nausea, No new weakness tingling or numbness, no SOB.  He does upper little confused.   Assessment  & Plan :     NKH in DM type II causing severe dehydration - he was treated with IV fluids and IV insulin drip, insulin levels and nonketotic hyperosmolar state much improved, transition to LR, subcu Lantus and sliding scale.  DM education.  Poor outpatient control due to hyperglycemia as suggested by A1c of 12.  Lab Results  Component Value Date   HGBA1C 12.2 (H) 07/19/2021   CBG (last 3)  Recent Labs    07/19/21 0404 07/19/21 0606 07/19/21 0910  GLUCAP 146* 200* 359*      2.  Metabolic encephalopathy.  Not convinced this is entirely acute.  No focal deficits but his overall memory and recollection is quite poor, will check TSH, B12, RPR, MRI brain.  Monitor.        Condition - Fair  Family Communication  :  None present  Code Status :  Full  Consults  :  None  PUD Prophylaxis :    Procedures  :      MRI      Disposition Plan  :    Status is:  Observation  The patient remains OBS appropriate and will d/c before 2 midnights.  Dispo:  Patient From: Home  Planned Disposition: Home  Medically stable for discharge: No  DVT Prophylaxis  :    enoxaparin (LOVENOX) injection 40 mg Start: 07/19/21 1000 SCDs Start: 07/18/21 2256     Lab Results  Component Value Date   PLT PLATELET CLUMPS NOTED ON SMEAR, UNABLE TO ESTIMATE 07/19/2021    Diet :  Diet Order             Diet Carb Modified Fluid consistency: Thin; Room service appropriate? Yes  Diet effective now                    Inpatient Medications  Scheduled Meds:  acetaminophen  650 mg Oral Once   enoxaparin (LOVENOX) injection  40 mg Subcutaneous Daily   insulin aspart  0-5 Units Subcutaneous QHS   insulin aspart  0-9 Units Subcutaneous TID WC   insulin aspart  3 Units Subcutaneous TID WC   insulin glargine-yfgn  20 Units Subcutaneous BID   insulin starter kit- pen needles  1 kit Other Once   living well with diabetes book   Does not apply Once   Continuous Infusions:  lactated ringers 75 mL/hr at 07/19/21 0742   PRN Meds:.acetaminophen, dextrose, melatonin, ondansetron (ZOFRAN) IV, polyethylene glycol  Antibiotics  :    Anti-infectives (From admission, onward)    None        Time Spent in minutes  30   Prashant Singh M.D on 07/19/2021 at 9:44 AM  To page go to [email or web address]   [facility] -  Office  [phone]    See all Orders from today for further details    Objective:   Vitals:   07/19/21 0747 07/19/21 0830 07/19/21 0900 07/19/21 0915  BP: 126/85 135/90 126/84 130/89  Pulse: 79 84 71 68  Resp: 15 13 (!) 21 15  Temp: (!) 97.4 F (36.3 C)     TempSrc: Oral     SpO2: 98% 97% 96% 96%  Weight: 83 kg     Height: 5' 11" (1.803 m)       Wt Readings from Last 3 Encounters:  07/19/21 83 kg  02/11/14 111.7 kg  12/24/11 119 kg     Intake/Output Summary (Last 24 hours) at 07/19/2021 0944 Last data filed at 07/19/2021  0746 Gross per 24 hour  Intake 894.56 ml  Output 2100 ml  Net -1205.44 ml     Physical Exam  Awake Alert x 2,  No new F.N deficits,   [place].AT,PERRAL Supple Neck,No JVD, No cervical lymphadenopathy appriciated.  Symmetrical Chest wall movement, Good air movement bilaterally, CTAB RRR,No Gallops,Rubs or new Murmurs, No Parasternal Heave +ve B.Sounds, Abd Soft, No tenderness, No organomegaly appriciated, No rebound - guarding or rigidity. No Cyanosis, Clubbing or edema, No new Rash or bruise        Data Review:    CBC Recent Labs  Lab 07/18/21 2016 07/18/21 2022 07/18/21 2342 07/19/21 0405  WBC 4.1  --  3.8* 4.0  HGB 12.3* 12.9* 12.3* 12.0*  HCT 37.0* 38.0* 36.2* 37.1*  PLT 184  --  170 PLATELET CLUMPS NOTED ON SMEAR, UNABLE TO ESTIMATE  MCV 92.0  --  90.7 92.8  MCH 30.6  --  30.8 30.0  MCHC 33.2  --  34.0 32.3  RDW 12.8  --  12.7 12.8  LYMPHSABS 1.9  --   --   --   MONOABS 0.4  --   --   --   EOSABS 0.1  --   --   --     BASOSABS 0.0  --   --   --     Recent Labs  Lab 07/18/21 2016 07/18/21 2022 07/18/21 2342 07/19/21 0405 07/19/21 0410  NA 129* 130*  --  140  --   K 4.7 4.6  --  3.5  --   CL 98  --   --  106  --   CO2 21*  --   --  26  --   GLUCOSE 751*  --   --  144*  --   BUN 20  --   --  17  --   CREATININE 1.47*  --  1.26* 1.11  --   CALCIUM 8.8*  --   --  9.0  --   AST 25  --   --   --   --   ALT 29  --   --   --   --   ALKPHOS 124  --   --   --   --   BILITOT 0.8  --   --   --   --   ALBUMIN 3.5  --   --   --   --   HGBA1C  --   --   --   --  12.2*    ------------------------------------------------------------------------------------------------------------------ No results for input(s): CHOL, HDL, LDLCALC, TRIG, CHOLHDL, LDLDIRECT in the last 72 hours.  Lab Results  Component Value Date   HGBA1C 12.2 (H) 07/19/2021   ------------------------------------------------------------------------------------------------------------------ No results  for input(s): TSH, T4TOTAL, T3FREE, THYROIDAB in the last 72 hours.  Invalid input(s): FREET3  Cardiac Enzymes No results for input(s): CKMB, TROPONINI, MYOGLOBIN in the last 168 hours.  Invalid input(s): CK ------------------------------------------------------------------------------------------------------------------ No results found for: BNP  Radiology Reports DG Chest Port 1 View  Result Date: 07/18/2021 CLINICAL DATA:  Hyperglycemia. EXAM: PORTABLE CHEST 1 VIEW COMPARISON:  None. FINDINGS: Upper normal heart size. Normal mediastinal contours allowing for rotation. Mild bibasilar atelectasis without confluent airspace disease. No pulmonary edema, pleural effusion, or pneumothorax. No acute osseous abnormalities are seen. IMPRESSION: Mild bibasilar atelectasis. Electronically Signed   By: Melanie  Sanford M.D.   On: 07/18/2021 22:49     

## 2021-07-19 NOTE — ED Notes (Signed)
Lunch tray delivered.

## 2021-07-19 NOTE — Significant Event (Addendum)
Patient is a poor historian at that this time per Oralia Manis from [place]. She states that Friday he was in [place] with her then took the bus to [place] area.  Stated that his blood sugars have been uncontrolled and patient has been forgetful, with Right eye pain.  Please call Luster Landsberg an out of town friend before discharge so she help him stay of track with his home insulin regime.

## 2021-07-19 NOTE — ED Notes (Signed)
Assesed pt. A/OX4

## 2021-07-19 NOTE — Progress Notes (Signed)
Inpatient Diabetes Program Recommendations  AACE/ADA: New Consensus Statement on Inpatient Glycemic Control (2015)  Target Ranges:  Prepandial:   less than 140 mg/dL      Peak postprandial:   less than 180 mg/dL (1-2 hours)      Critically ill patients:  140 - 180 mg/dL   Lab Results  Component Value Date   GLUCAP 200 (H) 07/19/2021   HGBA1C 12.2 (H) 07/19/2021    Review of Glycemic Control Results for MANA, MORISON (MRN [id number]) as of 07/19/2021 08:34  Ref. Range 07/19/2021 02:55 07/19/2021 04:04 07/19/2021 06:06  Glucose-Capillary Latest Ref Range: 70 - 99 mg/dL 155 (H) 146 (H) 200 (H)   Diabetes history: DM 2 Outpatient Diabetes medications:  Metformin 500 mg bid- ran out several days ago Current orders for Inpatient glycemic control:  Glargine-yfgn (Semglee) 20 units bid Novolog 3 units tid with meals Novolog sensitive tid with meals and HS Inpatient Diabetes Program Recommendations:    Agree with the addition of basal/bolus insulin.  Based on A1C, patient will need insulin at d/c.  Ordered LWWD booklet and insulin starter kit.   Patient currently in the ED.  Will attempt to speak with him once he gets to the floor.  Will need PCP and the ability to get medications/insulin.  Will order [facility] consult since patient has out of state medicaid.   Thanks,  Adah Perl, RN, BC-ADM Inpatient Diabetes Coordinator Pager [phone]  (8a-5p)

## 2021-07-20 ENCOUNTER — Inpatient Hospital Stay (HOSPITAL_COMMUNITY): Payer: Self-pay

## 2021-07-20 DIAGNOSIS — R4182 Altered mental status, unspecified: Secondary | ICD-10-CM

## 2021-07-20 LAB — COMPREHENSIVE METABOLIC PANEL
ALT: 23 U/L (ref 0–44)
AST: 21 U/L (ref 15–41)
Albumin: 2.9 g/dL — ABNORMAL LOW (ref 3.5–5.0)
Alkaline Phosphatase: 76 U/L (ref 38–126)
Anion gap: 8 (ref 5–15)
BUN: 14 mg/dL (ref 6–20)
CO2: 23 mmol/L (ref 22–32)
Calcium: 8.9 mg/dL (ref 8.9–10.3)
Chloride: 103 mmol/L (ref 98–111)
Creatinine, Ser: 1.17 mg/dL (ref 0.61–1.24)
GFR, Estimated: >60 mL/min (ref >60)
Glucose, Bld: 243 mg/dL — ABNORMAL HIGH (ref 70–99)
Potassium: 4.1 mmol/L (ref 3.5–5.1)
Sodium: 134 mmol/L — ABNORMAL LOW (ref 135–145)
Total Bilirubin: 1.1 mg/dL (ref 0.3–1.2)
Total Protein: 6 g/dL — ABNORMAL LOW (ref 6.5–8.1)

## 2021-07-20 LAB — CBC WITH DIFFERENTIAL/PLATELET
Abs Immature Granulocytes: 0 10*3/uL (ref 0.00–0.07)
Basophils Absolute: 0 10*3/uL (ref 0.0–0.1)
Basophils Relative: 0 %
Eosinophils Absolute: 0.1 10*3/uL (ref 0.0–0.5)
Eosinophils Relative: 2 %
HCT: 37.8 % — ABNORMAL LOW (ref 39.0–52.0)
Hemoglobin: 13 g/dL (ref 13.0–17.0)
Lymphocytes Relative: 53 %
Lymphs Abs: 2.6 10*3/uL (ref 0.7–4.0)
MCH: 30.9 pg (ref 26.0–34.0)
MCHC: 34.4 g/dL (ref 30.0–36.0)
MCV: 89.8 fL (ref 80.0–100.0)
Monocytes Absolute: 0.3 10*3/uL (ref 0.1–1.0)
Monocytes Relative: 6 %
Neutro Abs: 1.9 10*3/uL (ref 1.7–7.7)
Neutrophils Relative %: 39 %
Platelets: 196 10*3/uL (ref 150–400)
RBC: 4.21 MIL/uL — ABNORMAL LOW (ref 4.22–5.81)
RDW: 12.6 % (ref 11.5–15.5)
WBC: 4.9 10*3/uL (ref 4.0–10.5)
nRBC: 0 % (ref 0.0–0.2)

## 2021-07-20 LAB — GLUCOSE, CAPILLARY
Glucose-Capillary: 249 mg/dL — ABNORMAL HIGH (ref 70–99)
Glucose-Capillary: 249 mg/dL — ABNORMAL HIGH (ref 70–99)
Glucose-Capillary: 286 mg/dL — ABNORMAL HIGH (ref 70–99)
Glucose-Capillary: 207 mg/dL — ABNORMAL HIGH (ref 70–99)

## 2021-07-20 LAB — RPR: RPR Ser Ql: NONREACTIVE

## 2021-07-20 LAB — MAGNESIUM: Magnesium: 2 mg/dL (ref 1.7–2.4)

## 2021-07-20 MED ORDER — INSULIN ASPART PROT & ASPART (70-30 MIX) 100 UNIT/ML ~~LOC~~ SUSP
30.0000 [IU] | Freq: Two times a day (BID) | SUBCUTANEOUS | Status: DC
Start: 2021-07-20 — End: 2021-07-20

## 2021-07-20 MED ORDER — INSULIN GLARGINE-YFGN 100 UNIT/ML ~~LOC~~ SOLN
30.0000 [IU] | Freq: Two times a day (BID) | SUBCUTANEOUS | Status: DC
  Administered 2021-07-20: 30 [IU] via SUBCUTANEOUS
  Filled 2021-07-20 (×2): qty 0.3

## 2021-07-20 MED ORDER — INSULIN GLARGINE-YFGN 100 UNIT/ML ~~LOC~~ SOLN
30.0000 [IU] | Freq: Two times a day (BID) | SUBCUTANEOUS | Status: DC
  Administered 2021-07-20: 30 [IU] via SUBCUTANEOUS
  Filled 2021-07-20: qty 0.3

## 2021-07-20 MED ORDER — INSULIN ASPART 100 UNIT/ML IJ SOLN
3.0000 [IU] | Freq: Three times a day (TID) | INTRAMUSCULAR | Status: DC
  Administered 2021-07-21 (×2): 3 [IU] via SUBCUTANEOUS

## 2021-07-20 MED ORDER — INSULIN GLARGINE-YFGN 100 UNIT/ML ~~LOC~~ SOLN
35.0000 [IU] | Freq: Two times a day (BID) | SUBCUTANEOUS | Status: DC
  Filled 2021-07-20 (×2): qty 0.35

## 2021-07-20 MED ORDER — ASPIRIN 81 MG PO CHEW
81.0000 mg | CHEWABLE_TABLET | Freq: Every day | ORAL | Status: DC
  Administered 2021-07-20 – 2021-07-21 (×2): 81 mg via ORAL
  Filled 2021-07-20 (×2): qty 1

## 2021-07-20 NOTE — Progress Notes (Signed)
RN smelled cigarette smoke in pts room. Upon asking, pt admitted he had smoked and did not realize the danger. Pt was educated, cigarettes and lighter removed from room and placed in patient belongings bag labeled with pt sticker and placed at the nursing station. Pt aware that he will be able to receive these items at discharge. Pt denies having any other smoking paraphernalia. Pt declined asking for nicotine patch and states he is trying to quit.

## 2021-07-20 NOTE — Significant Event (Signed)
Patient states that he usually take insulin injections and needs instruction on new insulin orders. Informed patient that today we plan to get a return demonstration of insulin self administration.

## 2021-07-20 NOTE — Procedures (Signed)
Patient Name: Donal Lynam  MRN: [id number]  Epilepsy Attending: Charlsie Quest  Referring Physician/Provider: Dr Susa Raring Date: 07/20/2021 Duration: 22.46 mins  Patient history: 55 year old male with altered mental status.  EEG to evaluate for seizures.  Level of alertness: Awake, asleep  AEDs during EEG study: None  Technical aspects: This EEG study was done with scalp electrodes positioned according to the 10-20 International system of electrode placement. Electrical activity was acquired at a sampling rate of 500Hz  and reviewed with a high frequency filter of 70Hz  and a low frequency filter of 1Hz . EEG data were recorded continuously and digitally stored.   Description: The posterior dominant rhythm consists of 9-10 Hz activity of moderate voltage (25-35 uV) seen predominantly in posterior head regions, symmetric and reactive to eye opening and eye closing. Sleep was characterized by vertex waves, sleep spindles (12 to 14 Hz), maximal frontocentral region. Hyperventilation and photic stimulation were not performed.     IMPRESSION: This study is within normal limits. No seizures or epileptiform discharges were seen throughout the recording.  [place] 

## 2021-07-20 NOTE — Evaluation (Signed)
Physical Therapy Evaluation Patient Details Name: Malik Carter MRN: [id number] DOB: Jun 19, 1966 Today's Date: 07/20/2021   History of Present Illness  Malik Carter is a 55 y.o. male with medical history significant for Obesity, DM2, who recently relocated back to [place] area from [place] presents with nausea and no vomiting, hyperglycemia associated with increased thirst and urination for the past 2 days.  No abdominal pain.  He ran out of his metformin 2 days ago.   Clinical Impression  Patient is agreeable to PT assessment. Reports he has not really been out of bed. Reports he is feeling better. He is independent with bed mobility and transfers. Ambulated 300 feet with no AD and supervision. Patient reports decreased sensation in B feet that has been going on for some months. Educated patient to check feet and be extra careful with mobility. Patient will continue to benefit from skilled PT while here to improve mobility and strength.     Follow Up Recommendations No PT follow up    Equipment Recommendations  None recommended by PT    Recommendations for Other Services       Precautions / Restrictions Precautions Precautions: None Restrictions Weight Bearing Restrictions: No      Mobility  Bed Mobility Overal bed mobility: Independent                  Transfers Overall transfer level: Independent                  Ambulation/Gait Ambulation/Gait assistance: Supervision Gait Distance (Feet): 300 Feet Assistive device: None Gait Pattern/deviations: Step-through pattern;Decreased dorsiflexion - right;Decreased dorsiflexion - left Gait velocity: decr   General Gait Details: Initially ambulating with antalgic pattern. Improved with increased distance  Stairs            Wheelchair Mobility    Modified Rankin (Stroke Patients Only)       Balance Overall balance assessment: No apparent balance deficits (not formally assessed)                                            Pertinent Vitals/Pain Pain Assessment: No/denies pain    Home Living                   Additional Comments: moved here to live at [facility] house    Prior Function Level of Independence: Independent               Hand Dominance        Extremity/Trunk Assessment   Upper Extremity Assessment Upper Extremity Assessment: Overall WFL for tasks assessed    Lower Extremity Assessment Lower Extremity Assessment: Overall WFL for tasks assessed    Cervical / Trunk Assessment Cervical / Trunk Assessment: Normal  Communication   Communication: No difficulties  Cognition Arousal/Alertness: Awake/alert Behavior During Therapy: WFL for tasks assessed/performed Overall Cognitive Status: Within Functional Limits for tasks assessed                                        General Comments      Exercises     Assessment/Plan    PT Assessment Patient needs continued PT services  PT Problem List Decreased activity tolerance;Impaired sensation;Decreased strength;Decreased mobility       PT Treatment Interventions Gait training;Stair  training;Therapeutic activities;Patient/family education    PT Goals (Current goals can be found in the Care Plan section)  Acute Rehab PT Goals Patient Stated Goal: tn to [facility] house PT Goal Formulation: With patient Time For Goal Achievement: 07/27/21 Potential to Achieve Goals: Good    Frequency Min 2X/week   Barriers to discharge        Co-evaluation               AM-PAC PT "6 Clicks" Mobility  Outcome Measure Help needed turning from your back to your side while in a flat bed without using bedrails?: None Help needed moving from lying on your back to sitting on the side of a flat bed without using bedrails?: None Help needed moving to and from a bed to a chair (including a wheelchair)?: None Help needed standing up from a chair using your arms (e.g.,  wheelchair or bedside chair)?: None Help needed to walk in hospital room?: A Little Help needed climbing 3-5 steps with a railing? : A Little 6 Click Score: 22    End of Session   Activity Tolerance: Patient tolerated treatment well Patient left: in bed;with call bell/phone within reach Nurse Communication: Mobility status PT Visit Diagnosis: Difficulty in walking, not elsewhere classified (R26.2);Muscle weakness (generalized) (M62.81)    Time: 0950-1003 PT Time Calculation (min) (ACUTE ONLY): 13 min   Charges:   PT Evaluation $PT Eval Low Complexity: 1 Low          Chastin Garlitz, PT, GCS 07/20/21,10:17 AM

## 2021-07-20 NOTE — Progress Notes (Signed)
PROGRESS NOTE                                                                                                                                                                                                             Patient Demographics:    Malik Carter, is a 55 y.o. male, DOB - 23-Sep-1966, IOX:[id number]  Outpatient Primary MD for the patient is Patient, No Pcp Per (Inactive)    LOS - 1  Admit date - 07/18/2021    Chief Complaint  Patient presents with   Hyperglycemia       Brief Narrative (HPI from H&P)  Malik Carter is a 55 y.o. male with medical history significant for DM2, who recently relocated back to [place] area from [place] presents with nausea and no vomiting, hyperglycemia associated with increased thirst and urination for the past 2 days.   Subjective:   Patient in bed, appears comfortable, denies any headache, no fever, no chest pain or pressure, no shortness of breath , no abdominal pain. No new focal weakness.   Assessment  & Plan :     NKH in DM type II causing severe dehydration - he was treated with IV fluids and IV insulin drip, insulin levels and nonketotic hyperosmolar state much improved, transition to LR, subcu Lantus and sliding scale.  DM education.  Poor outpatient control due to hyperglycemia as suggested by A1c of 12.  Adjusted dose further on 07/20/2021 for better control, likely discharge on 07/21/2021 if better.  Lab Results  Component Value Date   HGBA1C 12.2 (H) 07/19/2021   CBG (last 3)  Recent Labs    07/19/21 1628 07/19/21 2112 07/20/21 0550  GLUCAP 170* 386* 249*   Lab Results  Component Value Date   CHOL 182 12/26/2011   HDL 60 12/26/2011   LDLCALC 98 12/26/2011   TRIG 120 12/26/2011   CHOLHDL 3.0 12/26/2011      2.  Metabolic encephalopathy.  Discussed with his friend Ms. Renee on 07/20/2021 he has been getting off-and-on confused when his sugars have been  high, MRI noted with nonspecific chronic changes, mentation has improved after supportive care and IV fluids, TSH is stable, stable B12, HIV is negative, pending RPR, continue supportive care with PT OT and monitor.    3.  Evidence of chronic small vessel disease on MRI likely  due to poorly controlled diabetes mellitus type 2, also per friend he was doing drugs and smoking cigarettes extensively till recently.  Will check lipid panel and placed on low-dose aspirin.  According to the friend's mentation sometimes goes off and on within a few hours, since there is mention of right temporal encephalomalacia on MRI Will get EEG as well.      Condition - Fair  Family Communication  : [name] [phone] on 07/20/2021  Code Status :  Full  Consults  :  None  PUD Prophylaxis :    Procedures  :      EEG  MRI - 1. No acute intracranial abnormality. 2. Moderate for age widely scattered cerebral white matter signal changes, nonspecific but favor due to chronic small vessel disease in this setting - with possible tiny area of chronic cortical encephalomalacia in the right temporal lobe. 3. Mild acute right maxillary sinusitis.      Disposition Plan  :    Status is: Observation  The patient remains OBS appropriate and will d/c before 2 midnights.  Dispo:  Patient From: Home  Planned Disposition: Home  Medically stable for discharge: No  DVT Prophylaxis  :    enoxaparin (LOVENOX) injection 40 mg Start: 07/19/21 1000 SCDs Start: 07/18/21 2256     Lab Results  Component Value Date   PLT 196 07/20/2021    Diet :  Diet Order             Diet Carb Modified Fluid consistency: Thin; Room service appropriate? Yes  Diet effective now                    Inpatient Medications  Scheduled Meds:  acetaminophen  650 mg Oral Once   aspirin  81 mg Oral Daily   enoxaparin (LOVENOX) injection  40 mg Subcutaneous Daily   insulin aspart  0-15 Units Subcutaneous TID WC   insulin  aspart  0-5 Units Subcutaneous QHS   insulin glargine-yfgn  30 Units Subcutaneous BID   insulin starter kit- pen needles  1 kit Other Once   living well with diabetes book   Does not apply Once   Continuous Infusions:   PRN Meds:.acetaminophen, dextrose, melatonin, ondansetron (ZOFRAN) IV, polyethylene glycol  Antibiotics  :    Anti-infectives (From admission, onward)    None        Time Spent in minutes  30   Lala Lund M.D on 07/20/2021 at 9:33 AM  To page go to [email or web address]   [facility] -  Office  [phone]    See all Orders from today for further details    Objective:   Vitals:   07/19/21 1927 07/19/21 2343 07/20/21 0550 07/20/21 0740  BP: 115/81 108/72 103/69 111/77  Pulse: 67 76 75 76  Resp: '18 18 17   ' Temp: 98.3 F (36.8 C) 98.6 F (37 C) 98.3 F (36.8 C) 98.7 F (37.1 C)  TempSrc: Oral Oral  Oral  SpO2: 100% 96% 100% 99%  Weight:   83.1 kg   Height:        Wt Readings from Last 3 Encounters:  07/20/21 83.1 kg  02/11/14 111.7 kg  12/24/11 119 kg     Intake/Output Summary (Last 24 hours) at 07/20/2021 0933 Last data filed at 07/20/2021 0830 Gross per 24 hour  Intake 2983.26 ml  Output 2250 ml  Net 733.26 ml     Physical Exam  Awake Alert, No new F.N deficits, Normal  affect [place].AT,PERRAL Supple Neck,No JVD, No cervical lymphadenopathy appriciated.  Symmetrical Chest wall movement, Good air movement bilaterally, CTAB RRR,No Gallops, Rubs or new Murmurs, No Parasternal Heave +ve B.Sounds, Abd Soft, No tenderness, No organomegaly appriciated, No rebound - guarding or rigidity. No Cyanosis, Clubbing or edema, No new Rash or bruise       Data Review:    CBC Recent Labs  Lab 07/18/21 2016 07/18/21 2022 07/18/21 2342 07/19/21 0405 07/20/21 0237  WBC 4.1  --  3.8* 4.0 4.9  HGB 12.3* 12.9* 12.3* 12.0* 13.0  HCT 37.0* 38.0* 36.2* 37.1* 37.8*  PLT 184  --  170 PLATELET CLUMPS NOTED ON SMEAR, UNABLE TO ESTIMATE 196  MCV  92.0  --  90.7 92.8 89.8  MCH 30.6  --  30.8 30.0 30.9  MCHC 33.2  --  34.0 32.3 34.4  RDW 12.8  --  12.7 12.8 12.6  LYMPHSABS 1.9  --   --   --  2.6  MONOABS 0.4  --   --   --  0.3  EOSABS 0.1  --   --   --  0.1  BASOSABS 0.0  --   --   --  0.0    Recent Labs  Lab 07/18/21 2016 07/18/21 2022 07/18/21 2342 07/19/21 0405 07/19/21 0410 07/19/21 1240 07/20/21 0237  NA 129* 130*  --  140  --   --  134*  K 4.7 4.6  --  3.5  --   --  4.1  CL 98  --   --  106  --   --  103  CO2 21*  --   --  26  --   --  23  GLUCOSE 751*  --   --  144*  --   --  243*  BUN 20  --   --  17  --   --  14  CREATININE 1.47*  --  1.26* 1.11  --   --  1.17  CALCIUM 8.8*  --   --  9.0  --   --  8.9  AST 25  --   --   --   --   --  21  ALT 29  --   --   --   --   --  23  ALKPHOS 124  --   --   --   --   --  76  BILITOT 0.8  --   --   --   --   --  1.1  ALBUMIN 3.5  --   --   --   --   --  2.9*  MG  --   --   --   --   --  1.9 2.0  TSH  --   --   --   --   --  0.812  --   HGBA1C  --   --   --   --  12.2*  --   --     ------------------------------------------------------------------------------------------------------------------ No results for input(s): CHOL, HDL, LDLCALC, TRIG, CHOLHDL, LDLDIRECT in the last 72 hours.  Lab Results  Component Value Date   HGBA1C 12.2 (H) 07/19/2021   ------------------------------------------------------------------------------------------------------------------ Recent Labs    07/19/21 1240  TSH 0.812    Cardiac Enzymes No results for input(s): CKMB, TROPONINI, MYOGLOBIN in the last 168 hours.  Invalid input(s): CK ------------------------------------------------------------------------------------------------------------------ No results found for: BNP  Radiology Reports MR BRAIN WO CONTRAST  Result Date: 07/19/2021 CLINICAL DATA:  55 year old male admitted with nausea, profound hyperglycemia, shortness of breath altered mental status, lethargy.  Confusion. Suspected metabolic encephalopathy. EXAM: MRI HEAD WITHOUT CONTRAST TECHNIQUE: Multiplanar, multiecho pulse sequences of the brain and surrounding structures were obtained without intravenous contrast. COMPARISON:  None. FINDINGS: Brain: Cerebral volume is within normal limits for age. No restricted diffusion to suggest acute infarction. No midline shift, mass effect, evidence of mass lesion, ventriculomegaly, extra-axial collection or acute intracranial hemorrhage. Cervicomedullary junction and pituitary are within normal limits. Patchy periventricular and other widely scattered bilateral subcortical white matter T2 and FLAIR hyperintensity. No chronic cerebral blood products. Questionable small area of chronic cortical encephalomalacia in the right lateral temporal lobe series 7, image 16. Deep gray matter nuclei, brainstem and cerebellum remain within normal limits. Vascular: Major intracranial vascular flow voids are preserved. Generalized intracranial artery tortuosity. The left vertebral artery appears mildly dominant. Skull and upper cervical spine: Negative visible cervical spine. Visualized bone marrow signal is within normal limits. Sinuses/Orbits: Evidence of a chronic right lamina papyracea fracture. Otherwise negative orbits. Small fluid level in the right maxillary sinus with mild mucosal thickening there, scattered trace mucosal thickening elsewhere. Other: Trace bilateral mastoid air cell fluid. Negative nasopharynx. Visible internal auditory structures appear normal. Negative visible scalp and face. IMPRESSION: 1. No acute intracranial abnormality. 2. Moderate for age widely scattered cerebral white matter signal changes, nonspecific but favor due to chronic small vessel disease in this setting - with possible tiny area of chronic cortical encephalomalacia in the right temporal lobe. 3. Mild acute right maxillary sinusitis. Electronically Signed   By: Genevie Ann M.D.   On: 07/19/2021 10:14    DG Chest Port 1 View  Result Date: 07/18/2021 CLINICAL DATA:  Hyperglycemia. EXAM: PORTABLE CHEST 1 VIEW COMPARISON:  None. FINDINGS: Upper normal heart size. Normal mediastinal contours allowing for rotation. Mild bibasilar atelectasis without confluent airspace disease. No pulmonary edema, pleural effusion, or pneumothorax. No acute osseous abnormalities are seen. IMPRESSION: Mild bibasilar atelectasis. Electronically Signed   By: Keith Rake M.D.   On: 07/18/2021 22:49

## 2021-07-20 NOTE — Progress Notes (Signed)
Inpatient Diabetes Program Recommendations  AACE/ADA: New Consensus Statement on Inpatient Glycemic Control   Target Ranges:  Prepandial:   less than 140 mg/dL      Peak postprandial:   less than 180 mg/dL (1-2 hours)      Critically ill patients:  140 - 180 mg/dL   Results for Malik Carter, CIANCI (MRN [id number]) as of 07/20/2021 08:10  Ref. Range 07/19/2021 06:06 07/19/2021 09:10 07/19/2021 11:59 07/19/2021 16:28 07/19/2021 21:12 07/20/2021 05:50  Glucose-Capillary Latest Ref Range: 70 - 99 mg/dL 767 (H) 341 (H) 937 (H) 170 (H) 386 (H) 249 (H)   Results for [name], [name] (MRN [id number]) as of 07/20/2021 08:10  Ref. Range 07/18/2021 20:16  Glucose Latest Ref Range: 70 - 99 mg/dL [facility])  Results for FERRY, Malik Carter (MRN [id number]) as of 07/20/2021 08:10  Ref. Range 07/19/2021 04:10  Hemoglobin A1C Latest Ref Range: 4.8 - 5.6 % 12.2 (H)   Review of Glycemic Control  Diabetes history: DM2 Outpatient Diabetes medications: Basal insulin 20 units daily, Short acting insulin 5 units TID, Metformin 500 mg BID Current orders for Inpatient glycemic control: Semglee 30 units BID, Novolog 0-15 units TID with meals, Novolog 0-5 units QHS, Novolog 3 units TID with meals  Inpatient Diabetes Program Recommendations:    Insulin: Noted Semglee increased this morning.   Outpatient DM medications: May want to consider discharging on 70/30 insulin.  NOTE: Kathlen Brunswick, RN, Diabetes coordinator spoke with patient and [name] from [facility] and on 07/19/21 and patient reported he had been on basal and short acting insulin but could not remember insulin names. Patient noted to have limited funds; may need to be prescribed Novolin 70/30 insulin as an outpatient due to cost. Will continue to follow along while inpatient.  Thanks, Orlando Penner, RN, MSN, CDE Diabetes Coordinator Inpatient Diabetes Program [phone] (Team Pager from 8am to 5pm)

## 2021-07-20 NOTE — Progress Notes (Signed)
EEG Completed; Results Pending  

## 2021-07-21 ENCOUNTER — Other Ambulatory Visit (HOSPITAL_COMMUNITY): Payer: Self-pay

## 2021-07-21 LAB — CBC WITH DIFFERENTIAL/PLATELET
Abs Immature Granulocytes: 0.06 10*3/uL (ref 0.00–0.07)
Basophils Absolute: 0.1 10*3/uL (ref 0.0–0.1)
Basophils Relative: 1 %
Eosinophils Absolute: 0.2 10*3/uL (ref 0.0–0.5)
Eosinophils Relative: 4 %
HCT: 41.7 % (ref 39.0–52.0)
Hemoglobin: 13.8 g/dL (ref 13.0–17.0)
Immature Granulocytes: 1 %
Lymphocytes Relative: 51 %
Lymphs Abs: 2.7 10*3/uL (ref 0.7–4.0)
MCH: 30.5 pg (ref 26.0–34.0)
MCHC: 33.1 g/dL (ref 30.0–36.0)
MCV: 92.1 fL (ref 80.0–100.0)
Monocytes Absolute: 0.3 10*3/uL (ref 0.1–1.0)
Monocytes Relative: 6 %
Neutro Abs: 1.9 10*3/uL (ref 1.7–7.7)
Neutrophils Relative %: 37 %
Platelets: 187 10*3/uL (ref 150–400)
RBC: 4.53 MIL/uL (ref 4.22–5.81)
RDW: 12.8 % (ref 11.5–15.5)
WBC: 5.2 10*3/uL (ref 4.0–10.5)
nRBC: 0 % (ref 0.0–0.2)

## 2021-07-21 LAB — COMPREHENSIVE METABOLIC PANEL
ALT: 25 U/L (ref 0–44)
AST: 21 U/L (ref 15–41)
Albumin: 3.2 g/dL — ABNORMAL LOW (ref 3.5–5.0)
Alkaline Phosphatase: 66 U/L (ref 38–126)
Anion gap: 9 (ref 5–15)
BUN: 13 mg/dL (ref 6–20)
CO2: 23 mmol/L (ref 22–32)
Calcium: 9.1 mg/dL (ref 8.9–10.3)
Chloride: 106 mmol/L (ref 98–111)
Creatinine, Ser: 1.14 mg/dL (ref 0.61–1.24)
GFR, Estimated: >60 mL/min (ref >60)
Glucose, Bld: 126 mg/dL — ABNORMAL HIGH (ref 70–99)
Potassium: 4 mmol/L (ref 3.5–5.1)
Sodium: 138 mmol/L (ref 135–145)
Total Bilirubin: 1.4 mg/dL — ABNORMAL HIGH (ref 0.3–1.2)
Total Protein: 6.7 g/dL (ref 6.5–8.1)

## 2021-07-21 LAB — LIPID PANEL
Cholesterol: 232 mg/dL — ABNORMAL HIGH (ref 0–200)
HDL: 109 mg/dL (ref >40)
LDL Cholesterol: 104 mg/dL — ABNORMAL HIGH (ref 0–99)
Total CHOL/HDL Ratio: 2.1 RATIO
Triglycerides: 94 mg/dL (ref <150)
VLDL: 19 mg/dL (ref 0–40)

## 2021-07-21 LAB — GLUCOSE, CAPILLARY
Glucose-Capillary: 103 mg/dL — ABNORMAL HIGH (ref 70–99)
Glucose-Capillary: 267 mg/dL — ABNORMAL HIGH (ref 70–99)
Glucose-Capillary: 126 mg/dL — ABNORMAL HIGH (ref 70–99)

## 2021-07-21 LAB — MAGNESIUM: Magnesium: 2 mg/dL (ref 1.7–2.4)

## 2021-07-21 MED ORDER — INSULIN GLARGINE-YFGN 100 UNIT/ML ~~LOC~~ SOLN
32.0000 [IU] | Freq: Two times a day (BID) | SUBCUTANEOUS | Status: DC
  Administered 2021-07-21: 32 [IU] via SUBCUTANEOUS
  Filled 2021-07-21 (×2): qty 0.32

## 2021-07-21 MED ORDER — ASPIRIN 81 MG PO CHEW
81.0000 mg | CHEWABLE_TABLET | Freq: Every day | ORAL | 0 refills | Status: AC
  Filled 2021-07-21: qty 30, 30d supply, fill #0

## 2021-07-21 MED ORDER — INSULIN GLARGINE 100 UNIT/ML SOLOSTAR PEN
32.0000 [IU] | PEN_INJECTOR | Freq: Two times a day (BID) | SUBCUTANEOUS | 0 refills | Status: AC
  Filled 2021-07-21: qty 21, 33d supply, fill #0

## 2021-07-21 MED ORDER — "INSULIN SYRINGE-NEEDLE U-100 30G X 1/2"" 1 ML MISC"
0 refills | Status: AC
  Filled 2021-07-21: qty 1, 30d supply, fill #0

## 2021-07-21 MED ORDER — INSULIN ASPART 100 UNIT/ML FLEXPEN
PEN_INJECTOR | SUBCUTANEOUS | 11 refills | Status: AC
  Filled 2021-07-21: qty 9, 30d supply, fill #0

## 2021-07-21 MED ORDER — METFORMIN HCL 500 MG PO TABS
500.0000 mg | ORAL_TABLET | Freq: Two times a day (BID) | ORAL | 0 refills | Status: AC
  Filled 2021-07-21: qty 60, 30d supply, fill #0

## 2021-07-21 MED ORDER — PENTIPS 32G X 4 MM MISC
1.0000 | Freq: Once | 0 refills | Status: AC
  Filled 2021-07-21: qty 100, 30d supply, fill #0

## 2021-07-21 MED ORDER — ATORVASTATIN CALCIUM 40 MG PO TABS
40.0000 mg | ORAL_TABLET | Freq: Every day | ORAL | 0 refills | Status: AC
  Filled 2021-07-21: qty 30, 30d supply, fill #0

## 2021-07-21 MED ORDER — BLOOD GLUCOSE MONITOR SYSTEM W/DEVICE KIT
PACK | 1 refills | Status: AC
  Filled 2021-07-21: qty 1, 30d supply, fill #0

## 2021-07-21 NOTE — TOC Initial Note (Signed)
Transition of Care [facility]) - Initial/Assessment Note    Patient Details  Name: Malik Carter MRN: [id number] Date of Birth: 12-May-1966  Transition of [facility]) CM/SW Contact:    Malik Carter, [place] Phone Number: 07/21/2021, 10:19 AM  Clinical Narrative:        CSW met with pt for substance [place] consult. Pt was previously using cocaine and marijuana. He was at a treatment center in [place] and just moved to [facility] in [place] this past weekend. Malik Carter has a recovery program pt is involved in. He has been abstinent from substance use for the past 4 months. CSW provides list of additional local resources. Pt consents to [place] contacting his friend Malik Carter and [employer].   CSW and RNCM called [facility] [employer] and confirmed they would assist with transportation. Pt just needs to call them at [phone] when ready for DC. Meds will be filled at [facility]; pt cannot have syringes as he lives at a recovery center.   CSW called and updated friend Malik Carter.              Expected Discharge Plan: Home/Self Care [facility]) Barriers to Discharge: No Barriers Identified   Patient Goals and CMS Choice        Expected Discharge Plan and Services Expected Discharge Plan: Home/Self Care [facility])       Living arrangements for the past 2 months: Group Home [facility]) Expected Discharge Date: 07/21/21                                    Prior Living Arrangements/Services Living arrangements for the past 2 months: [place] [facility]) Lives with:: Facility Resident          Need for Family Participation in Patient Care: No (Comment) Care giver support system in place?: Yes (comment)      Activities of Daily Living      Permission Sought/Granted                  Emotional Assessment Appearance:: Appears stated age Attitude/Demeanor/Rapport: Engaged Affect (typically observed): Accepting Orientation: : Oriented to Self, Oriented to  Place, Oriented to  Time, Oriented to Situation Alcohol / Substance Use: Illicit Drugs Psych Involvement: No (comment)  Admission diagnosis:  Hyperglycemia [R73.9] DKA, type 2 ([place]) [E11.10] Patient Active Problem List   Diagnosis Date Noted   DKA, type 2 ([place]) 07/19/2021   Hyperglycemia 07/18/2021   Phimosis/adherent prepuce 12/30/2011   Hypokalemia 12/25/2011   Type 2 diabetes mellitus with hyperosmolar nonketotic hyperglycemia ([place]) 12/24/2011   Dehydration 12/24/2011   ARF (acute renal failure) ([place]) 12/24/2011   PCP:  Patient, No Pcp Per (Inactive) Pharmacy:   [id number] [place]Phone: [phone]: [phone] [facility] [address]Phone: [phone]: [phone]    Social Determinants of Health (SDOH) Interventions    Readmission Risk Interventions No flowsheet data found.

## 2021-07-21 NOTE — Progress Notes (Signed)
Inpatient Diabetes Program Recommendations  AACE/ADA: New Consensus Statement on Inpatient Glycemic Control   Target Ranges:  Prepandial:   less than 140 mg/dL      Peak postprandial:   less than 180 mg/dL (1-2 hours)      Critically ill patients:  140 - 180 mg/dL   Results for JAQUIN, COY (MRN [id number]) as of 07/21/2021 07:37  Ref. Range 07/20/2021 05:50 07/20/2021 10:42 07/20/2021 16:18 07/20/2021 20:55 07/21/2021 05:54  Glucose-Capillary Latest Ref Range: 70 - 99 mg/dL 810 (H) 175 (H) 102 (H) 207 (H) 103 (H)    Review of Glycemic Control  Diabetes history: DM2 Outpatient Diabetes medications: Basal insulin 20 units daily, Short acting insulin 5 units TID, Metformin 500 mg BID Current orders for Inpatient glycemic control: Semglee 32 units BID, Novolog 0-15 units TID with meals, Novolog 0-5 units QHS, Novolog 3 units TID with meals   Inpatient Diabetes Program Recommendations:     Insulin: Noted Semglee increased this morning. Post prandial glucose consistently elevated. May want to consider increasing meal coverage insulin.   Outpatient DM medications: May want to consider discharging on 70/30 insulin.   Thanks, Orlando Penner, RN, MSN, CDE Diabetes Coordinator Inpatient Diabetes Program [phone] (Team Pager from 8am to 5pm)

## 2021-07-21 NOTE — Discharge Summary (Signed)
Malik Carter TEL:[id number] DOB: 1966/07/07 DOA: 07/18/2021  PCP: Patient, No Pcp Per (Inactive)  Admit date: 07/18/2021  Discharge date: 07/21/2021  Admitted From: Shelter  Disposition:  Shelter per SW   Recommendations for Outpatient Follow-up:   Follow up with PCP in 1-2 weeks  PCP Please obtain BMP/CBC, 2 view CXR in 1week,  (see Discharge instructions)   PCP Please follow up on the following pending results: Monitor glycemic control along with lipid panel, CBC CMP in 2 to 3 weeks, recheck B12 levels in 2 to 3 months.   Home Health: None Equipment/Devices: None  Consultations: None  Discharge Condition: Stable    CODE STATUS: Full    Diet Recommendation: Heart Healthy Low Carb  Diet Order             Diet Carb Modified Fluid consistency: Thin; Room service appropriate? Yes  Diet effective now                    Chief Complaint  Patient presents with   Hyperglycemia     Brief history of present illness from the day of admission and additional interim summary    Malik Carter is a 55 y.o. male with medical history significant for DM2, who recently relocated back to [place] area from [place] presents with nausea and no vomiting, hyperglycemia associated with increased thirst and urination for the past 2 days.                                                                 Hospital Course    NKH in DM type II causing severe dehydration - he was treated with IV fluids and IV insulin drip, insulin levels and nonketotic hyperosmolar state much improved, transition to LR, subcu Lantus and sliding scale.  DM education has been provided.  Poor outpatient control due to hyperglycemia as suggested by A1c of 12.  Will be discharged today with insulin and testing supplies.  Follow-up with PCP in 1 to 2  weeks.     2.  Metabolic encephalopathy.  Discussed with his friend Ms. Renee on 07/20/2021 he has been getting off-and-on confused when his sugars have been high, MRI noted with nonspecific chronic changes, mentation has improved after supportive care and IV fluids, TSH is stable, stable EEG, B12, HIV and RPR negative,  continue supportive care with PT OT and monitor.     3.  Evidence of chronic small vessel disease on MRI likely due to poorly controlled diabetes mellitus type 2, also per friend he was doing drugs and smoking cigarettes extensively till recently.  Counseled to quit all, placed on Crestor and aspirin, LDL was above 100, blood pressure stable.  Continue monitoring secondary risk factors outpatient by PCP.     Discharge diagnosis  Active Problems:   Hyperglycemia   DKA, type 2 [facility])    Discharge instructions    Discharge Instructions     Discharge instructions   Complete by: As directed    Follow with Primary MD in 7 days   Get CBC, CMP, 2 view Chest X ray -  checked next visit within 1 week by Primary MD    Activity: As tolerated with Full fall precautions use walker/cane & assistance as needed  Disposition Home    Diet: Heart Healthy Low Carb.  Accuchecks 4 times/day, Once in AM empty stomach and then before each meal. Log in all results and show them to your Prim.MD in 3 days. If any glucose reading is under 80 or above 300 call your Prim MD immidiately. Follow Low glucose instructions for glucose under 80 as instructed.  Special Instructions: If you have smoked or chewed Tobacco  in the last 2 yrs please stop smoking, stop any regular Alcohol  and or any Recreational drug use.  On your next visit with your primary care physician please Get Medicines reviewed and adjusted.  Please request your Prim.MD to go over all Hospital Tests and Procedure/Radiological results at the follow up, please get all Hospital records sent to your Prim MD by signing hospital  release before you go home.  If you experience worsening of your admission symptoms, develop shortness of breath, life threatening emergency, suicidal or homicidal thoughts you must seek medical attention immediately by calling 911 or calling your MD immediately  if symptoms less severe.  You Must read complete instructions/literature along with all the possible adverse reactions/side effects for all the Medicines you take and that have been prescribed to you. Take any new Medicines after you have completely understood and accpet all the possible adverse reactions/side effects.   Increase activity slowly   Complete by: As directed        Discharge Medications   Allergies as of 07/21/2021   No Known Allergies      Medication List     TAKE these medications    acetaminophen 325 MG tablet Commonly known as: TYLENOL Take 650 mg by mouth every 6 (six) hours as needed for mild pain or headache.   aspirin 81 MG chewable tablet Chew 1 tablet (81 mg total) by mouth daily.   atorvastatin 40 MG tablet Commonly known as: Lipitor Take 1 tablet (40 mg total) by mouth daily.   blood glucose meter kit and supplies Kit Dispense based on patient and insurance preference. Use up to four times daily as directed. (FOR ICD-9 250.00, 250.01). For QAC - HS accuchecks.   insulin aspart 100 UNIT/ML FlexPen Commonly known as: NOVOLOG Before each meal 3 times a day, 140-199 - 2 units, 200-250 - 4 units, 251-299 - 6 units,  300-349 - 8 units,  350 or above 10 units. Insulin PEN if approved, provide syringes and needles if needed.   insulin glargine-yfgn 100 UNIT/ML injection Commonly known as: SEMGLEE Inject 0.32 mLs (32 Units total) into the skin 2 (two) times daily.   insulin starter kit- pen needles Misc 1 kit by Other route once for 1 dose.   Insulin Syringe-Needle U-100 25G X 1" 1 ML Misc For 4 times a day insulin SQ, 1 month supply. Diagnosis E11.65   metFORMIN 500 MG tablet Commonly known  as: GLUCOPHAGE Take 1 tablet (500 mg total) by mouth 2 (two) times daily with a meal.         Follow-up Information  [facility]. Schedule an appointment as soon as possible for a visit in 1 week(s).   Contact information: [address] [place] [address] [phone]                Major procedures and Radiology Reports - PLEASE review detailed and final reports thoroughly  -        MR BRAIN WO CONTRAST  Result Date: 07/19/2021 CLINICAL DATA:  55 year old male admitted with nausea, profound hyperglycemia, shortness of breath altered mental status, lethargy. Confusion. Suspected metabolic encephalopathy. EXAM: MRI HEAD WITHOUT CONTRAST TECHNIQUE: Multiplanar, multiecho pulse sequences of the brain and surrounding structures were obtained without intravenous contrast. COMPARISON:  None. FINDINGS: Brain: Cerebral volume is within normal limits for age. No restricted diffusion to suggest acute infarction. No midline shift, mass effect, evidence of mass lesion, ventriculomegaly, extra-axial collection or acute intracranial hemorrhage. Cervicomedullary junction and pituitary are within normal limits. Patchy periventricular and other widely scattered bilateral subcortical white matter T2 and FLAIR hyperintensity. No chronic cerebral blood products. Questionable small area of chronic cortical encephalomalacia in the right lateral temporal lobe series 7, image 16. Deep gray matter nuclei, brainstem and cerebellum remain within normal limits. Vascular: Major intracranial vascular flow voids are preserved. Generalized intracranial artery tortuosity. The left vertebral artery appears mildly dominant. Skull and upper cervical spine: Negative visible cervical spine. Visualized bone marrow signal is within normal limits. Sinuses/Orbits: Evidence of a chronic right lamina papyracea fracture. Otherwise negative orbits. Small fluid level in the  right maxillary sinus with mild mucosal thickening there, scattered trace mucosal thickening elsewhere. Other: Trace bilateral mastoid air cell fluid. Negative nasopharynx. Visible internal auditory structures appear normal. Negative visible scalp and face. IMPRESSION: 1. No acute intracranial abnormality. 2. Moderate for age widely scattered cerebral white matter signal changes, nonspecific but favor due to chronic small vessel disease in this setting - with possible tiny area of chronic cortical encephalomalacia in the right temporal lobe. 3. Mild acute right maxillary sinusitis. Electronically Signed   By: Genevie Ann M.D.   On: 07/19/2021 10:14   DG Chest Port 1 View  Result Date: 07/18/2021 CLINICAL DATA:  Hyperglycemia. EXAM: PORTABLE CHEST 1 VIEW COMPARISON:  None. FINDINGS: Upper normal heart size. Normal mediastinal contours allowing for rotation. Mild bibasilar atelectasis without confluent airspace disease. No pulmonary edema, pleural effusion, or pneumothorax. No acute osseous abnormalities are seen. IMPRESSION: Mild bibasilar atelectasis. Electronically Signed   By: Keith Rake M.D.   On: 07/18/2021 22:49   EEG adult  Result Date: 07/20/2021 Lora Havens, MD     07/20/2021  1:27 PM Patient Name: Malik Carter MRN: [id number] Epilepsy Attending: Lora Havens Referring Physician/Provider: Dr Lala Lund Date: 07/20/2021 Duration: 22.46 mins Patient history: 55 year old male with altered mental status.  EEG to evaluate for seizures. Level of alertness: Awake, asleep AEDs during EEG study: None Technical aspects: This EEG study was done with scalp electrodes positioned according to the 10-20 International system of electrode placement. Electrical activity was acquired at a sampling rate of '500Hz'  and reviewed with a high frequency filter of '70Hz'  and a low frequency filter of '1Hz' . EEG data were recorded continuously and digitally stored. Description: The posterior dominant rhythm consists of 9-10  Hz activity of moderate voltage (25-35 uV) seen predominantly in posterior head regions, symmetric and reactive to eye opening and eye closing. Sleep was characterized by vertex waves, sleep spindles (12 to 14 Hz), maximal frontocentral region. Hyperventilation and photic  stimulation were not performed.   IMPRESSION: This study is within normal limits. No seizures or epileptiform discharges were seen throughout the recording. Priyanka Barbra Sarks        Today   Subjective    Malik Carter today has no headache,no chest abdominal pain,no new weakness tingling or numbness, feels much better wants to go home today.     Objective   Blood pressure 109/72, pulse 67, temperature 98.3 F (36.8 C), resp. rate 17, height '5\' 11"'  (1.803 m), weight 82.3 kg, SpO2 97 %.   Intake/Output Summary (Last 24 hours) at 07/21/2021 0907 Last data filed at 07/20/2021 1300 Gross per 24 hour  Intake 240 ml  Output 800 ml  Net -560 ml    Exam  Awake Alert, No new F.N deficits, Normal affect [place].AT,PERRAL Supple Neck,No JVD, No cervical lymphadenopathy appriciated.  Symmetrical Chest wall movement, Good air movement bilaterally, CTAB RRR,No Gallops,Rubs or new Murmurs, No Parasternal Heave +ve B.Sounds, Abd Soft, Non tender, No organomegaly appriciated, No rebound -guarding or rigidity. No Cyanosis, Clubbing or edema, No new Rash or bruise   Data Review   CBC w Diff:  Lab Results  Component Value Date   WBC 5.2 07/21/2021   HGB 13.8 07/21/2021   HCT 41.7 07/21/2021   PLT 187 07/21/2021   LYMPHOPCT 51 07/21/2021   MONOPCT 6 07/21/2021   EOSPCT 4 07/21/2021   BASOPCT 1 07/21/2021    CMP:  Lab Results  Component Value Date   NA 138 07/21/2021   K 4.0 07/21/2021   CL 106 07/21/2021   CO2 23 07/21/2021   BUN 13 07/21/2021   CREATININE 1.14 07/21/2021   PROT 6.7 07/21/2021   ALBUMIN 3.2 (L) 07/21/2021   BILITOT 1.4 (H) 07/21/2021   ALKPHOS 66 07/21/2021   AST 21 07/21/2021   ALT 25 07/21/2021   . Lab Results  Component Value Date   HGBA1C 12.2 (H) 07/19/2021    Lab Results  Component Value Date   CHOL 232 (H) 07/21/2021   HDL 109 07/21/2021   LDLCALC 104 (H) 07/21/2021   TRIG 94 07/21/2021   CHOLHDL 2.1 07/21/2021   CBG (last 3)  Recent Labs    07/20/21 1618 07/20/21 2055 07/21/21 0554  GLUCAP 286* 207* 103*   Lab Results  Component Value Date   TSH 0.812 07/19/2021     Total Time in preparing paper work, data evaluation and todays exam - 33 minutes  Lala Lund M.D on 07/21/2021 at 9:07 AM  [facility]

## 2021-07-21 NOTE — Discharge Instructions (Signed)
Follow with Primary MD in 7 days   Get CBC, CMP, 2 view Chest X ray -  checked next visit within 1 week by Primary MD    Activity: As tolerated with Full fall precautions use walker/cane & assistance as needed  Disposition Home    Diet: Heart Healthy Low Carb  Accuchecks 4 times/day, Once in AM empty stomach and then before each meal. Log in all results and show them to your Prim.MD in 3 days. If any glucose reading is under 80 or above 300 call your Prim MD immidiately. Follow Low glucose instructions for glucose under 80 as instructed.  Special Instructions: If you have smoked or chewed Tobacco  in the last 2 yrs please stop smoking, stop any regular Alcohol  and or any Recreational drug use.  On your next visit with your primary care physician please Get Medicines reviewed and adjusted.  Please request your Prim.MD to go over all Hospital Tests and Procedure/Radiological results at the follow up, please get all Hospital records sent to your Prim MD by signing hospital release before you go home.  If you experience worsening of your admission symptoms, develop shortness of breath, life threatening emergency, suicidal or homicidal thoughts you must seek medical attention immediately by calling 911 or calling your MD immediately  if symptoms less severe.  You Must read complete instructions/literature along with all the possible adverse reactions/side effects for all the Medicines you take and that have been prescribed to you. Take any new Medicines after you have completely understood and accpet all the possible adverse reactions/side effects.         

## 2021-07-21 NOTE — TOC Transition Note (Signed)
Transition of Care [facility]) - CM/SW Discharge Note   Patient Details  Name: Malik Carter MRN: [id number] Date of Birth: May 17, 1966  Transition of Care [facility]) CM/SW Contact:  Leone Haven, RN Phone Number: 07/21/2021, 10:26 AM   Clinical Narrative:    NCM assisted patient with [facility] for meds. TOC pharmacy filling meds.  CSW and RNCM confirmed [facility] will transport him at discharge.    Final next level of care: Home/Self Care Barriers to Discharge: No Barriers Identified   Patient Goals and CMS Choice        Discharge Placement                       Discharge Plan and Services                  DME Agency: NA                  Social Determinants of Health (SDOH) Interventions     Readmission Risk Interventions No flowsheet data found.

## 2021-07-21 NOTE — Plan of Care (Signed)

## 2021-08-04 ENCOUNTER — Telehealth: Payer: Self-pay | Admitting: Family Medicine

## 2021-08-04 NOTE — Telephone Encounter (Signed)
Called to confirm pt appt person who answered the phone stated pt was not there

## 2021-08-05 ENCOUNTER — Inpatient Hospital Stay: Payer: Self-pay | Admitting: Family Medicine

## 2021-08-12 ENCOUNTER — Emergency Department (HOSPITAL_COMMUNITY)
Admission: EM | Admit: 2021-08-12 | Discharge: 2021-08-12 | Disposition: A | Discharge status: Home / Self Care | Payer: Self-pay | Attending: Emergency Medicine | Admitting: Emergency Medicine

## 2021-08-12 ENCOUNTER — Encounter (HOSPITAL_COMMUNITY): Payer: Self-pay | Admitting: *Deleted

## 2021-08-12 ENCOUNTER — Other Ambulatory Visit: Payer: Self-pay

## 2021-08-12 DIAGNOSIS — R55 Syncope and collapse: Secondary | ICD-10-CM | POA: Insufficient documentation

## 2021-08-12 DIAGNOSIS — Z5321 Procedure and treatment not carried out due to patient leaving prior to being seen by health care provider: Secondary | ICD-10-CM | POA: Insufficient documentation

## 2021-08-12 LAB — URINALYSIS, ROUTINE W REFLEX MICROSCOPIC
Bacteria, UA: NONE SEEN
Bilirubin Urine: NEGATIVE
Glucose, UA: >=500 mg/dL — AB
Hgb urine dipstick: NEGATIVE
Ketones, ur: NEGATIVE mg/dL
Nitrite: NEGATIVE
Protein, ur: NEGATIVE mg/dL
Specific Gravity, Urine: 1.02 (ref 1.005–1.030)
pH: 5 (ref 5.0–8.0)

## 2021-08-12 LAB — CBC
HCT: 42.2 % (ref 39.0–52.0)
Hemoglobin: 14.1 g/dL (ref 13.0–17.0)
MCH: 30.6 pg (ref 26.0–34.0)
MCHC: 33.4 g/dL (ref 30.0–36.0)
MCV: 91.5 fL (ref 80.0–100.0)
Platelets: 188 10*3/uL (ref 150–400)
RBC: 4.61 MIL/uL (ref 4.22–5.81)
RDW: 12.9 % (ref 11.5–15.5)
WBC: 4.4 10*3/uL (ref 4.0–10.5)
nRBC: 0 % (ref 0.0–0.2)

## 2021-08-12 LAB — BASIC METABOLIC PANEL
Anion gap: 9 (ref 5–15)
BUN: 9 mg/dL (ref 6–20)
CO2: 23 mmol/L (ref 22–32)
Calcium: 8.8 mg/dL — ABNORMAL LOW (ref 8.9–10.3)
Chloride: 99 mmol/L (ref 98–111)
Creatinine, Ser: 1.3 mg/dL — ABNORMAL HIGH (ref 0.61–1.24)
GFR, Estimated: >60 mL/min (ref >60)
Glucose, Bld: 577 mg/dL (ref 70–99)
Potassium: 4.1 mmol/L (ref 3.5–5.1)
Sodium: 131 mmol/L — ABNORMAL LOW (ref 135–145)

## 2021-08-12 LAB — CBG MONITORING, ED: Glucose-Capillary: 533 mg/dL (ref 70–99)

## 2021-08-12 NOTE — ED Triage Notes (Signed)
Pt is in a rehab program for substance abuse. Pt was feeling fine this am, playing bball then had witnessed syncopal episode. Since then, is having some confusion regarding time and place. Pt has no specific complaints. Grips are equal, no arm drift, no facial droop noted. Reports hx of diabetes and taking meds as prescribed.

## 2021-08-12 NOTE — ED Notes (Signed)
Pt left AMA due to wait time  

## 2021-08-20 ENCOUNTER — Emergency Department (HOSPITAL_COMMUNITY)
Admission: EM | Admit: 2021-08-20 | Discharge: 2021-08-20 | Disposition: A | Discharge status: Home / Self Care | Payer: Medicaid - Out of State | Attending: Emergency Medicine | Admitting: Emergency Medicine

## 2021-08-20 ENCOUNTER — Other Ambulatory Visit: Payer: Self-pay

## 2021-08-20 ENCOUNTER — Emergency Department (HOSPITAL_COMMUNITY): Payer: Medicaid - Out of State

## 2021-08-20 DIAGNOSIS — Z794 Long term (current) use of insulin: Secondary | ICD-10-CM | POA: Diagnosis not present

## 2021-08-20 DIAGNOSIS — E1110 Type 2 diabetes mellitus with ketoacidosis without coma: Secondary | ICD-10-CM | POA: Insufficient documentation

## 2021-08-20 DIAGNOSIS — Z7984 Long term (current) use of oral hypoglycemic drugs: Secondary | ICD-10-CM | POA: Insufficient documentation

## 2021-08-20 DIAGNOSIS — Z7982 Long term (current) use of aspirin: Secondary | ICD-10-CM | POA: Insufficient documentation

## 2021-08-20 DIAGNOSIS — Z87891 Personal history of nicotine dependence: Secondary | ICD-10-CM | POA: Insufficient documentation

## 2021-08-20 DIAGNOSIS — R0602 Shortness of breath: Secondary | ICD-10-CM | POA: Diagnosis not present

## 2021-08-20 DIAGNOSIS — Z20822 Contact with and (suspected) exposure to covid-19: Secondary | ICD-10-CM | POA: Diagnosis not present

## 2021-08-20 LAB — BASIC METABOLIC PANEL
Anion gap: 10 (ref 5–15)
BUN: 10 mg/dL (ref 6–20)
CO2: 25 mmol/L (ref 22–32)
Calcium: 9.2 mg/dL (ref 8.9–10.3)
Chloride: 103 mmol/L (ref 98–111)
Creatinine, Ser: 1.39 mg/dL — ABNORMAL HIGH (ref 0.61–1.24)
GFR, Estimated: 59 mL/min — ABNORMAL LOW (ref >60)
Glucose, Bld: 136 mg/dL — ABNORMAL HIGH (ref 70–99)
Potassium: 3.7 mmol/L (ref 3.5–5.1)
Sodium: 138 mmol/L (ref 135–145)

## 2021-08-20 LAB — CBC WITH DIFFERENTIAL/PLATELET
Abs Immature Granulocytes: 0.04 10*3/uL (ref 0.00–0.07)
Basophils Absolute: 0.1 10*3/uL (ref 0.0–0.1)
Basophils Relative: 1 %
Eosinophils Absolute: 0.4 10*3/uL (ref 0.0–0.5)
Eosinophils Relative: 8 %
HCT: 43.7 % (ref 39.0–52.0)
Hemoglobin: 14.5 g/dL (ref 13.0–17.0)
Immature Granulocytes: 1 %
Lymphocytes Relative: 42 %
Lymphs Abs: 2.3 10*3/uL (ref 0.7–4.0)
MCH: 30.5 pg (ref 26.0–34.0)
MCHC: 33.2 g/dL (ref 30.0–36.0)
MCV: 91.8 fL (ref 80.0–100.0)
Monocytes Absolute: 0.4 10*3/uL (ref 0.1–1.0)
Monocytes Relative: 7 %
Neutro Abs: 2.2 10*3/uL (ref 1.7–7.7)
Neutrophils Relative %: 41 %
Platelets: 187 10*3/uL (ref 150–400)
RBC: 4.76 MIL/uL (ref 4.22–5.81)
RDW: 12.9 % (ref 11.5–15.5)
WBC: 5.5 10*3/uL (ref 4.0–10.5)
nRBC: 0 % (ref 0.0–0.2)

## 2021-08-20 LAB — BRAIN NATRIURETIC PEPTIDE: B Natriuretic Peptide: 4.6 pg/mL (ref 0.0–100.0)

## 2021-08-20 LAB — RESP PANEL BY RT-PCR (FLU A&B, COVID) ARPGX2
Influenza A by PCR: NEGATIVE
Influenza B by PCR: NEGATIVE
SARS Coronavirus 2 by RT PCR: NEGATIVE

## 2021-08-20 LAB — TROPONIN I (HIGH SENSITIVITY)
Troponin I (High Sensitivity): 12 ng/L (ref <18)
Troponin I (High Sensitivity): 10 ng/L (ref <18)

## 2021-08-20 MED ORDER — AZITHROMYCIN 250 MG PO TABS: 250.0000 mg | ORAL_TABLET | Freq: Every day | ORAL | 0 refills | Status: AC

## 2021-08-20 MED ORDER — AEROCHAMBER PLUS FLO-VU LARGE MISC
1.0000 | Freq: Once | Status: AC
  Administered 2021-08-20: 1

## 2021-08-20 MED ORDER — AEROCHAMBER PLUS FLO-VU LARGE MISC
Status: AC
  Filled 2021-08-20: qty 1

## 2021-08-20 MED ORDER — ALBUTEROL SULFATE HFA 108 (90 BASE) MCG/ACT IN AERS
2.0000 | INHALATION_SPRAY | RESPIRATORY_TRACT | Status: DC | PRN
  Administered 2021-08-20: 2 via RESPIRATORY_TRACT
  Filled 2021-08-20: qty 6.7

## 2021-08-20 NOTE — ED Provider Notes (Signed)
Emergency Medicine Provider Triage Evaluation Note  Malik Carter , a 55 y.o. male  was evaluated in triage.  Pt complains of SOB that awakened him from sleep.  Denies fever, chills or cough.  Review of Systems  Positive: sob Negative: Fever, cough  Physical Exam  BP (!) 139/101 (BP Location: Right Arm)   Pulse 94   Temp 98.9 F (37.2 C) (Oral)   Resp (!) 24   Ht 5\' 11"  (1.803 m)   Wt 82.3 kg   SpO2 96%   BMI 25.31 kg/m  Gen:   Awake, no distress   Resp:  Normal effort  MSK:   Moves extremities without difficulty  Other:    Medical Decision Making  Medically screening exam initiated at 1:37 AM.  Appropriate orders placed.  was informed that the remainder of the evaluation will be completed by another provider, this initial triage assessment does not replace that evaluation, and the importance of remaining in the ED until their evaluation is complete.  SOB   Zenda Alpers, PA-C 08/20/21 0139    Palumbo, April, MD 08/20/21 [phone]

## 2021-08-20 NOTE — ED Triage Notes (Signed)
Shortness of breath that woke pt up from sleep. Denies any fever, cough and sore throat.   Hx of smoking.

## 2021-08-20 NOTE — ED Provider Notes (Signed)
[facility] EMERGENCY DEPARTMENT Provider Note   CSN: [id number] Arrival date & time: 08/20/21  0125     History Chief Complaint  Patient presents with   Shortness of Breath    Malik Carter is a 55 y.o. male presents to the emergency department with shortness of breath onset around 2:30 am.  Patient reports she was having trouble sleeping therefore he snuck out of the halfway house he is living in to smoke a cigarette.  When he returned and tried to lay down he felt short of breath.  Denies previous episodes like this.  He did not have chest pain, palpitations or other symptoms at that time. Denies fever, chills, cough, known sick contacts.  Patient reports that while he has been here his shortness of breath has completely resolved.  Patient reports he has a history of cocaine abuse but has been clean for 5 months.  Denies smoking marijuana or cocaine tonight.  No other known aggravating or alleviating factors.  The history is provided by the patient and medical records. No language interpreter was used.      Past Medical History:  Diagnosis Date   Diabetes mellitus without complication ([place])    not taking insulin for a while    Patient Active Problem List   Diagnosis Date Noted   DKA, type 2 ([place]) 07/19/2021   Hyperglycemia 07/18/2021   Phimosis/adherent prepuce 12/30/2011   Hypokalemia 12/25/2011   Type 2 diabetes mellitus with hyperosmolar nonketotic hyperglycemia ([place]) 12/24/2011   Dehydration 12/24/2011   ARF (acute renal failure) ([place]) 12/24/2011    No past surgical history on file.     Family History  Problem Relation Age of Onset   Diabetes Father     Social History   Tobacco Use   Smoking status: Former   Smokeless tobacco: Never  Substance Use Topics   Alcohol use: No   Drug use: Yes    Types: Marijuana, Cocaine    Home Medications Prior to Admission medications   Medication Sig Start Date End Date Taking? Authorizing Provider   azithromycin (ZITHROMAX) 250 MG tablet Take 1 tablet (250 mg total) by mouth daily. Take first 2 tablets together, then 1 every day until finished. 08/20/21  Yes Lc Joynt, Jarrett Soho, PA-C  acetaminophen (TYLENOL) 325 MG tablet Take 650 mg by mouth every 6 (six) hours as needed for mild pain or headache.    [provider]  aspirin 81 MG chewable tablet Chew 1 tablet (81 mg total) by mouth daily. 07/21/21   Thurnell Lose, MD  atorvastatin (LIPITOR) 40 MG tablet Take 1 tablet (40 mg total) by mouth daily. 07/21/21   Thurnell Lose, MD  Blood Glucose Monitoring Suppl (BLOOD GLUCOSE MONITOR SYSTEM) w/Device KIT use as directed 07/21/21   Thurnell Lose, MD  insulin aspart (NOVOLOG) 100 UNIT/ML FlexPen Before each meal 3 times a day, 140-199 - 2 units, 200-250 - 4 units, 251-299 - 6 units,  300-349 - 8 units,  350 or above 10 units. 07/21/21   Thurnell Lose, MD  insulin glargine (LANTUS) 100 UNIT/ML Solostar Pen Inject 32 Units into the skin 2 (two) times daily. 07/21/21   Thurnell Lose, MD  Insulin Syringe-Needle U-100 30G X 1/2" 1 ML MISC use as directed 07/21/21   Thurnell Lose, MD  metFORMIN (GLUCOPHAGE) 500 MG tablet Take 1 tablet (500 mg total) by mouth 2 (two) times daily with a meal. 07/21/21   Thurnell Lose, MD  Allergies    Patient has no known allergies.  Review of Systems   Review of Systems  Constitutional:  Negative for appetite change, diaphoresis, fatigue, fever and unexpected weight change.  HENT:  Negative for mouth sores.   Eyes:  Negative for visual disturbance.  Respiratory:  Positive for shortness of breath (fully resolved). Negative for cough, chest tightness and wheezing.   Cardiovascular:  Negative for chest pain.  Gastrointestinal:  Negative for abdominal pain, constipation, diarrhea, nausea and vomiting.  Endocrine: Negative for polydipsia, polyphagia and polyuria.  Genitourinary:  Negative for dysuria, frequency, hematuria and urgency.   Musculoskeletal:  Negative for back pain and neck stiffness.  Skin:  Negative for rash.  Allergic/Immunologic: Negative for immunocompromised state.  Neurological:  Negative for syncope, light-headedness and headaches.  Hematological:  Does not bruise/bleed easily.  Psychiatric/Behavioral:  Negative for sleep disturbance. The patient is not nervous/anxious.    Physical Exam Updated Vital Signs BP (!) 157/99   Pulse 78   Temp 98.7 F (37.1 C) (Oral)   Resp 16   Ht 5' 11" (1.803 m)   Wt 82.3 kg   SpO2 99%   BMI 25.31 kg/m   Physical Exam Vitals and nursing note reviewed.  Constitutional:      General: He is not in acute distress.    Appearance: He is not diaphoretic.  HENT:     Head: Normocephalic.  Eyes:     General: No scleral icterus.    Conjunctiva/sclera: Conjunctivae normal.  Cardiovascular:     Rate and Rhythm: Normal rate and regular rhythm.     Pulses: Normal pulses.          Radial pulses are 2+ on the right side and 2+ on the left side.  Pulmonary:     Effort: Pulmonary effort is normal. No tachypnea, accessory muscle usage, prolonged expiration, respiratory distress or retractions.     Breath sounds: Normal breath sounds. No stridor.     Comments: Equal chest rise. No increased work of breathing. Abdominal:     General: There is no distension.     Palpations: Abdomen is soft.     Tenderness: There is no abdominal tenderness. There is no guarding or rebound.  Musculoskeletal:        General: Normal range of motion.     Cervical back: Normal range of motion.     Right lower leg: No tenderness. No edema.     Left lower leg: No tenderness. No edema.     Comments: Moves all extremities equally and without difficulty.  Skin:    General: Skin is warm and dry.     Capillary Refill: Capillary refill takes less than 2 seconds.  Neurological:     Mental Status: He is alert.     GCS: GCS eye subscore is 4. GCS verbal subscore is 5. GCS motor subscore is 6.      Comments: Speech is clear and goal oriented.  Psychiatric:        Mood and Affect: Mood normal.    ED Results / Procedures / Treatments   Labs (all labs ordered are listed, but only abnormal results are displayed) Labs Reviewed  BASIC METABOLIC PANEL - Abnormal; Notable for the following components:      Result Value   Glucose, Bld 136 (*)    Creatinine, Ser 1.39 (*)    GFR, Estimated 59 (*)    All other components within normal limits  RESP PANEL BY RT-PCR (FLU A&B, COVID) ARPGX2  CBC WITH DIFFERENTIAL/PLATELET  BRAIN NATRIURETIC PEPTIDE  TROPONIN I (HIGH SENSITIVITY)  TROPONIN I (HIGH SENSITIVITY)    EKG EKG Interpretation  Date/Time:  Thursday August 20 2021 01:40:05 EDT Ventricular Rate:  95 PR Interval:  170 QRS Duration: 100 QT Interval:  360 QTC Calculation: 452 R Axis:   -47 Text Interpretation: Normal sinus rhythm Left anterior fascicular block Septal infarct , age undetermined Abnormal ECG TWI resolved from last Confirmed by Addison Lank [phone]) on 08/20/2021 5:36:49 AM  Radiology DG Chest 2 View  Result Date: 08/20/2021 CLINICAL DATA:  Shortness of breath. EXAM: CHEST - 2 VIEW COMPARISON:  Chest radiograph dated 07/18/2021 FINDINGS: Bibasilar streaky densities, likely atelectasis. Infiltrate is not excluded clinical correlation recommended. No focal consolidation, pleural effusion or pneumothorax. The cardiac silhouette is within normal limits. No acute osseous pathology. IMPRESSION: Bibasilar atelectasis versus less likely infiltrate. Electronically Signed   By: Anner Crete M.D.   On: 08/20/2021 02:28    Procedures Procedures   Medications Ordered in ED Medications  albuterol (VENTOLIN HFA) 108 (90 Base) MCG/ACT inhaler 2 puff (2 puffs Inhalation Given 08/20/21 0619)  AeroChamber Plus Flo-Vu Large MISC 1 each (1 each Other Given 08/20/21 0620)  AeroChamber Plus Flo-Vu Large MISC (  Given 08/20/21 4239)    ED Course  I have reviewed the triage vital signs  and the nursing notes.  Pertinent labs & imaging results that were available during my care of the patient were reviewed by me and considered in my medical decision making (see chart for details).    MDM Rules/Calculators/A&P                           Patient presents with shortness of breath, now resolved.  Patient without history or risk factors for DVT/PE.  No tachycardia.  Chest x-ray with questionable atelectasis versus infiltrate.  EKG without acute abnormality.  Troponins negative.  As patient is a smoker in a group home, will treat for possible community-acquired pneumonia with azithromycin.  Albuterol given.  Vitals improved.  BP 120/87   Pulse 81   Temp 98.7 F (37.1 C) (Oral)   Resp 19   Ht _0  (1.803 m)   Wt 82.3 kg   SpO2 98%   BMI 25.31 kg/m   Final Clinical Impression(s) / ED Diagnoses Final diagnoses:  SOB (shortness of breath)    Rx / DC Orders ED Discharge Orders          Ordered    azithromycin (ZITHROMAX) 250 MG tablet  Daily        08/20/21 0642             Muthersbaugh, Jarrett Soho, PA-C 08/20/21 0649    Fatima Blank, MD 08/20/21 [phone]

## 2021-08-20 NOTE — Discharge Instructions (Addendum)
1. Medications: albuterol, azithromycin, usual home medications 2. Treatment: rest, drink plenty of fluids,  3. Follow Up: Please followup with your primary doctor in 2-3 days for discussion of your diagnoses and further evaluation after today's visit; if you do not have a primary care doctor use the resource guide provided to find one; Please return to the ER for new or worsening symptoms.

## 2021-08-26 ENCOUNTER — Ambulatory Visit: Payer: Self-pay | Admitting: Nurse Practitioner

## 2021-08-31 ENCOUNTER — Inpatient Hospital Stay (INDEPENDENT_AMBULATORY_CARE_PROVIDER_SITE_OTHER): Payer: Self-pay | Admitting: Primary Care

## 2021-11-11 IMAGING — CR DG CHEST 2V
2 series · 2 of 2 positions shown · non-contrast
Comparison: Chest radiograph dated 07/18/2021

CLINICAL DATA: Shortness of breath.

EXAM:
CHEST - 2 VIEW

[chest pa]
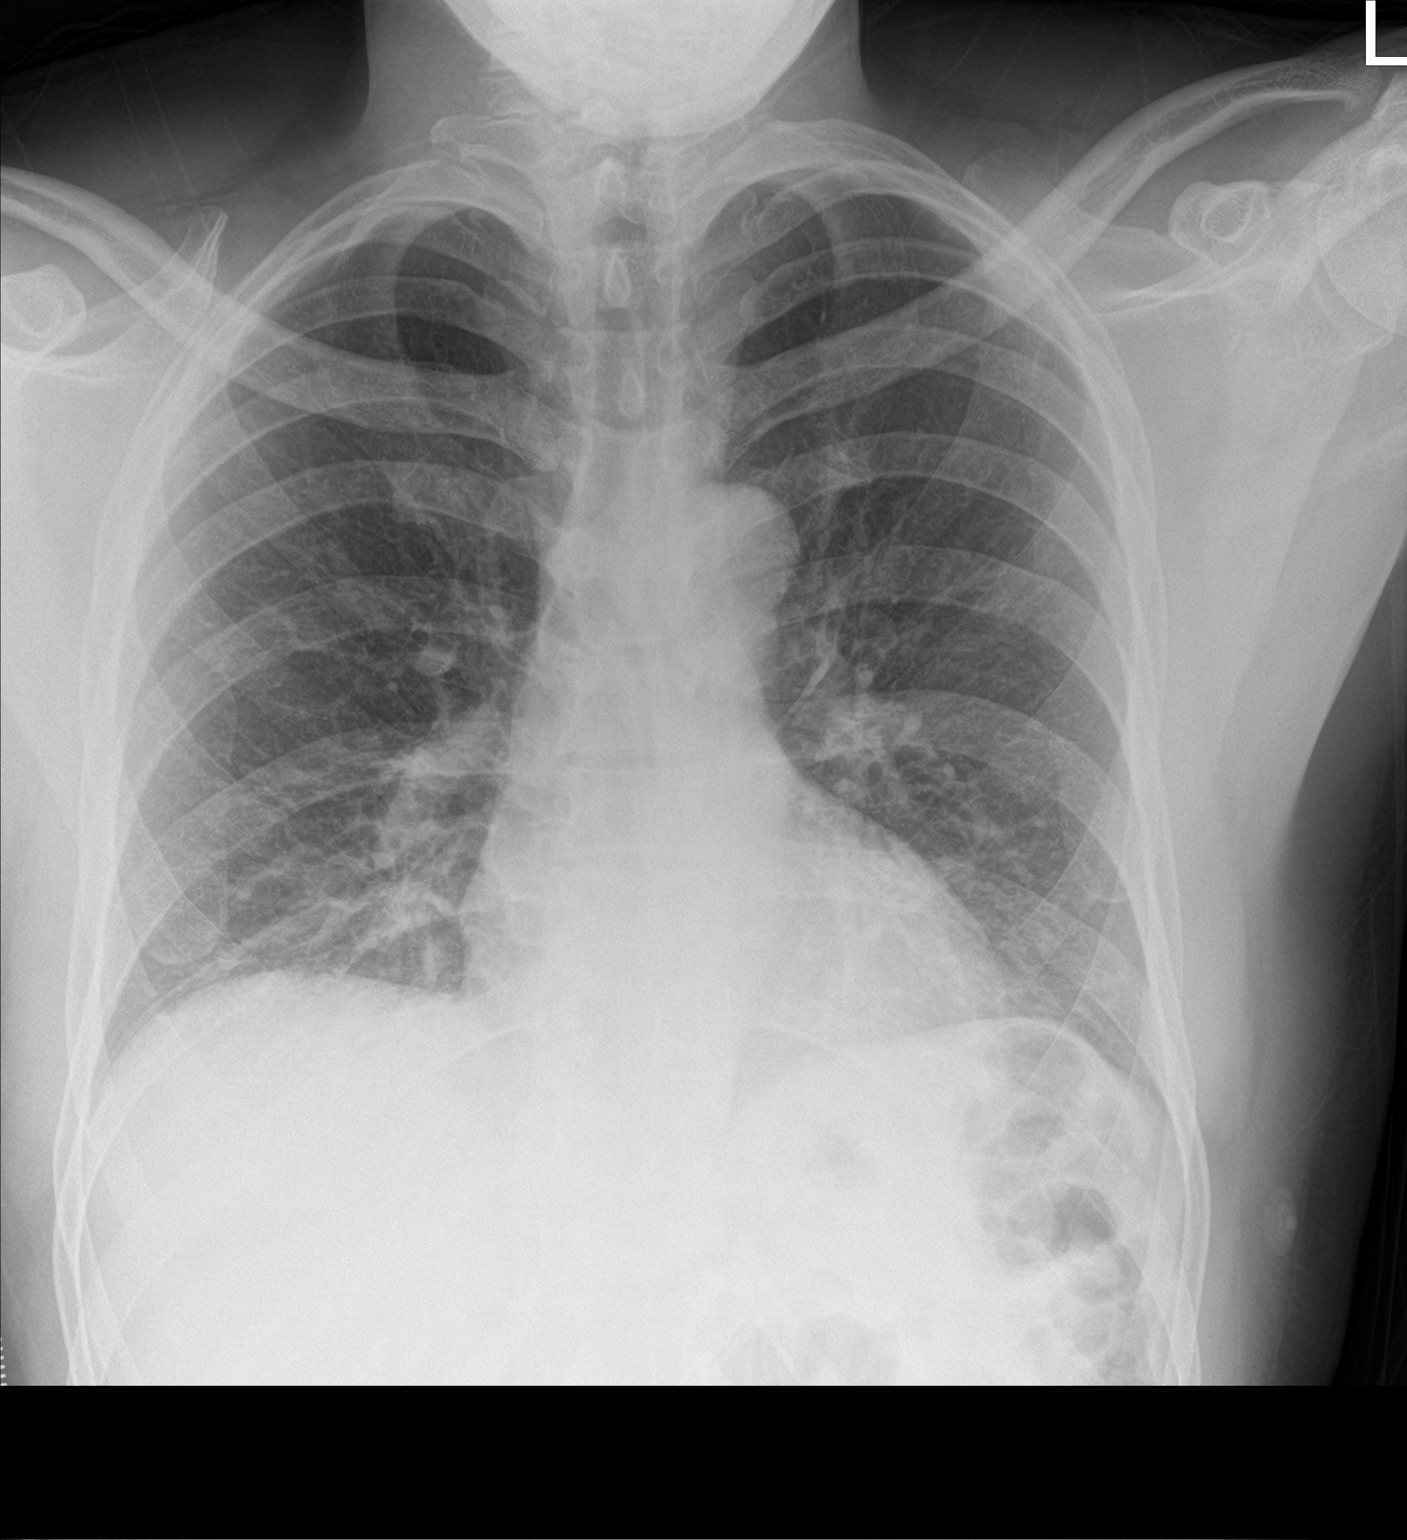

[chest lat]
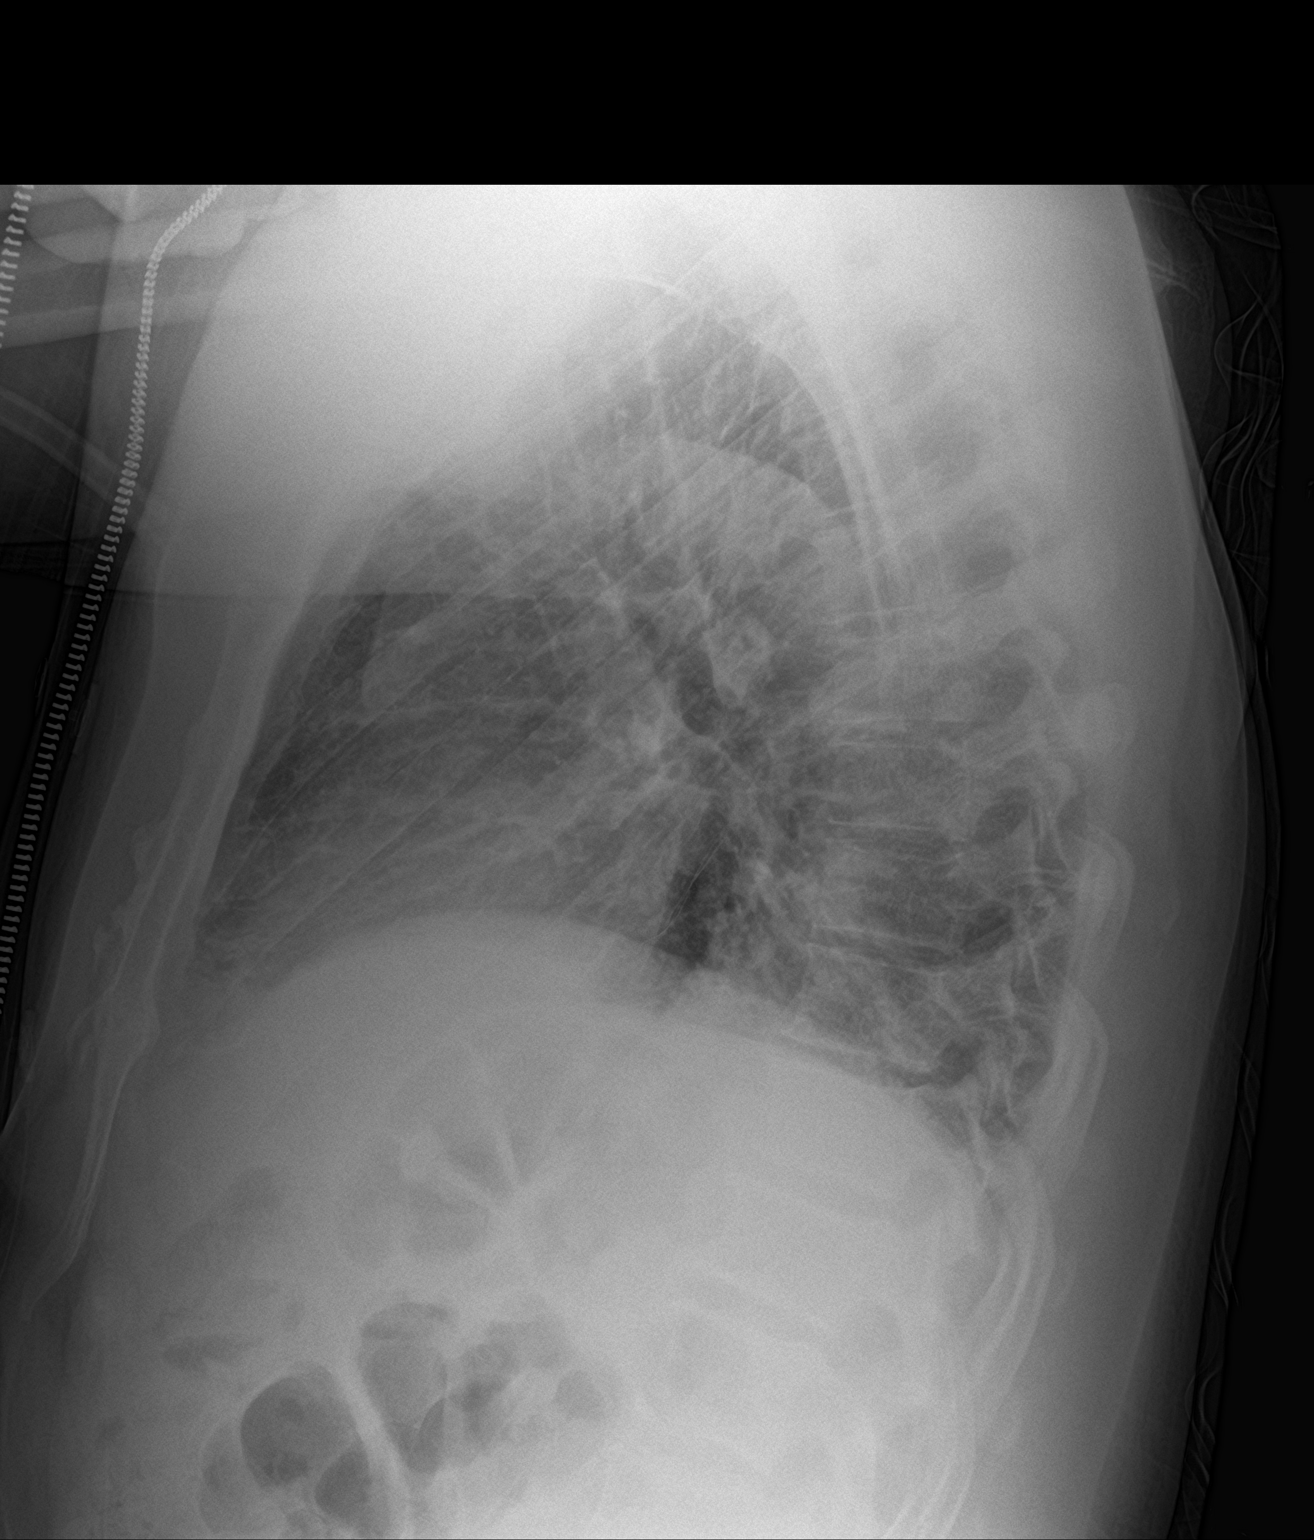

[2 of 2 positions shown; findings below may reference images not displayed]

FINDINGS: Bibasilar streaky densities, likely atelectasis. Infiltrate is not
excluded clinical correlation recommended. No focal consolidation,
pleural effusion or pneumothorax. The cardiac silhouette is within
normal limits. No acute osseous pathology.
IMPRESSION: Bibasilar atelectasis versus less likely infiltrate.

## 2023-11-30 ENCOUNTER — Other Ambulatory Visit: Payer: Self-pay
